# Patient Record
Sex: Male | Born: 1960 | Race: White | Hispanic: No | Marital: Married | Smoking: Never smoker
Health system: Southern US, Community
[De-identification: ages and names within clinical notes are randomized; demographics above are authoritative.]

## PROBLEM LIST (undated history)

## (undated) DIAGNOSIS — Z87442 Personal history of urinary calculi: Secondary | ICD-10-CM

## (undated) DIAGNOSIS — T7840XA Allergy, unspecified, initial encounter: Secondary | ICD-10-CM

## (undated) DIAGNOSIS — J45909 Unspecified asthma, uncomplicated: Secondary | ICD-10-CM

## (undated) DIAGNOSIS — I10 Essential (primary) hypertension: Secondary | ICD-10-CM

## (undated) HISTORY — PX: DENTAL SURGERY: SHX609

## (undated) HISTORY — DX: Personal history of urinary calculi: Z87.442

## (undated) HISTORY — DX: Unspecified asthma, uncomplicated: J45.909

## (undated) HISTORY — DX: Essential (primary) hypertension: I10

## (undated) HISTORY — DX: Allergy, unspecified, initial encounter: T78.40XA

---

## 2008-01-05 ENCOUNTER — Emergency Department (HOSPITAL_COMMUNITY): Admission: EM | Admit: 2008-01-05 | Discharge: 2008-01-05 | Payer: Self-pay | Admitting: Emergency Medicine

## 2008-06-16 ENCOUNTER — Ambulatory Visit: Payer: Self-pay | Admitting: Internal Medicine

## 2008-06-16 DIAGNOSIS — Z87442 Personal history of urinary calculi: Secondary | ICD-10-CM | POA: Insufficient documentation

## 2008-06-16 DIAGNOSIS — I10 Essential (primary) hypertension: Secondary | ICD-10-CM | POA: Insufficient documentation

## 2008-06-17 ENCOUNTER — Encounter: Payer: Self-pay | Admitting: Internal Medicine

## 2008-07-28 ENCOUNTER — Ambulatory Visit: Payer: Self-pay | Admitting: Internal Medicine

## 2008-07-28 DIAGNOSIS — N522 Drug-induced erectile dysfunction: Secondary | ICD-10-CM | POA: Insufficient documentation

## 2008-10-27 ENCOUNTER — Ambulatory Visit: Payer: Self-pay | Admitting: Internal Medicine

## 2008-10-27 DIAGNOSIS — N401 Enlarged prostate with lower urinary tract symptoms: Secondary | ICD-10-CM | POA: Insufficient documentation

## 2008-10-27 LAB — CONVERTED CEMR LAB
BUN: 17 mg/dL (ref 6–23)
Chloride: 104 meq/L (ref 96–112)
Cholesterol: 197 mg/dL (ref 0–200)
GFR calc non Af Amer: 85.54 mL/min (ref >60)
HDL: 57.3 mg/dL (ref >39.00)
LDL Cholesterol: 120 mg/dL — ABNORMAL HIGH (ref 0–99)
Potassium: 3.7 meq/L (ref 3.5–5.1)
Sodium: 139 meq/L (ref 135–145)
Triglycerides: 98 mg/dL (ref 0.0–149.0)
VLDL: 19.6 mg/dL (ref 0.0–40.0)

## 2008-10-31 ENCOUNTER — Encounter: Payer: Self-pay | Admitting: Internal Medicine

## 2008-11-10 ENCOUNTER — Telehealth: Payer: Self-pay | Admitting: Internal Medicine

## 2008-11-30 ENCOUNTER — Telehealth: Payer: Self-pay | Admitting: Internal Medicine

## 2009-03-02 ENCOUNTER — Ambulatory Visit: Payer: Self-pay | Admitting: Internal Medicine

## 2009-03-21 ENCOUNTER — Telehealth: Payer: Self-pay | Admitting: Internal Medicine

## 2009-04-12 ENCOUNTER — Telehealth: Payer: Self-pay | Admitting: Internal Medicine

## 2009-06-08 ENCOUNTER — Telehealth: Payer: Self-pay | Admitting: Internal Medicine

## 2009-06-08 ENCOUNTER — Ambulatory Visit: Payer: Self-pay | Admitting: Endocrinology

## 2009-06-08 ENCOUNTER — Encounter: Payer: Self-pay | Admitting: Internal Medicine

## 2009-06-08 DIAGNOSIS — E291 Testicular hypofunction: Secondary | ICD-10-CM | POA: Insufficient documentation

## 2009-06-08 LAB — CONVERTED CEMR LAB
BUN: 17 mg/dL (ref 6–23)
CO2: 29 meq/L (ref 19–32)
Calcium: 9.1 mg/dL (ref 8.4–10.5)
Chloride: 101 meq/L (ref 96–112)
Creatinine, Ser: 1 mg/dL (ref 0.4–1.5)
Glucose, Bld: 99 mg/dL (ref 70–99)

## 2009-07-18 ENCOUNTER — Telehealth: Payer: Self-pay | Admitting: Internal Medicine

## 2009-07-26 ENCOUNTER — Telehealth: Payer: Self-pay | Admitting: Internal Medicine

## 2009-07-27 ENCOUNTER — Telehealth: Payer: Self-pay | Admitting: Internal Medicine

## 2009-10-05 ENCOUNTER — Ambulatory Visit: Payer: Self-pay | Admitting: Internal Medicine

## 2009-10-05 LAB — CONVERTED CEMR LAB
BUN: 24 mg/dL — ABNORMAL HIGH (ref 6–23)
CO2: 28 meq/L (ref 19–32)
Calcium: 9.4 mg/dL (ref 8.4–10.5)
Creatinine, Ser: 0.9 mg/dL (ref 0.4–1.5)
GFR calc non Af Amer: 101.39 mL/min (ref >60)
Glucose, Bld: 89 mg/dL (ref 70–99)
Sodium: 142 meq/L (ref 135–145)

## 2010-01-09 ENCOUNTER — Telehealth: Payer: Self-pay | Admitting: Internal Medicine

## 2010-04-05 ENCOUNTER — Ambulatory Visit: Payer: Self-pay | Admitting: Internal Medicine

## 2010-04-19 IMAGING — CT CT ABDOMEN W/O CM
2 of 4 series · 14 of 32 positions shown, 19 images · non-contrast
Comparison: No priors

CT ABDOMEN

CLINICAL DATA: Sudden onset of left flank pain

CT ABDOMEN AND PELVIS WITHOUT CONTRAST
TECHNIQUE: Multidetector CT imaging of the abdomen and pelvis was
performed following the standard
protocol without intravenous contrast.

[Series 2: <(id) stone a/p >(id) · axial · 0.76mm/px · z∈[-479,-174]mm · 8 of 78 slices shown, 13 images]
[im 9/78  soft-tissue]
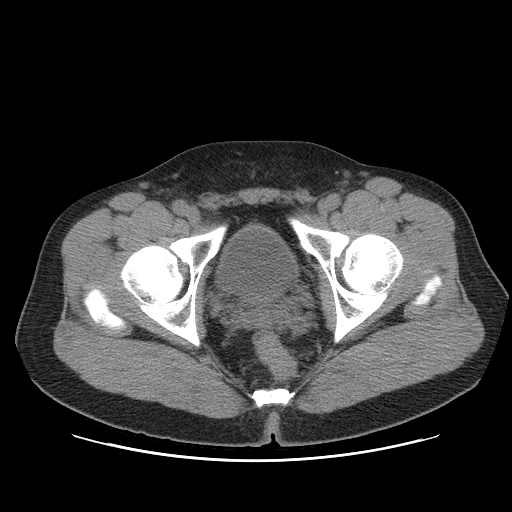
[im 9/78  bone]
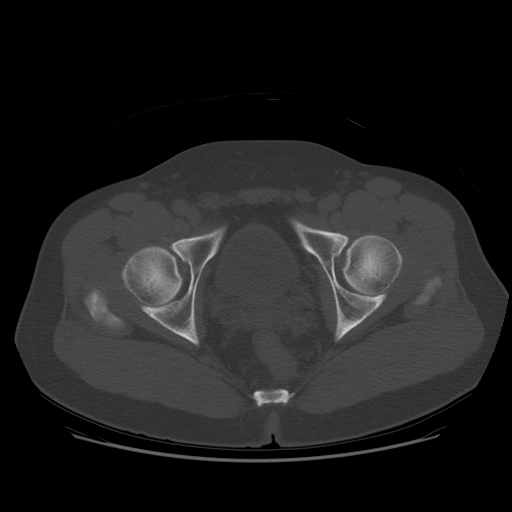
[im 18/78  soft-tissue]
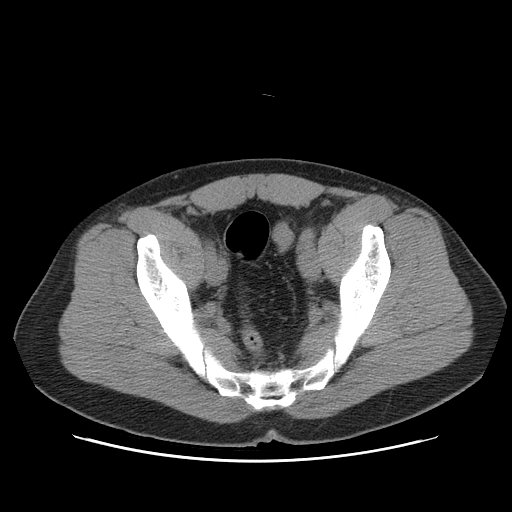
[im 26/78  soft-tissue]
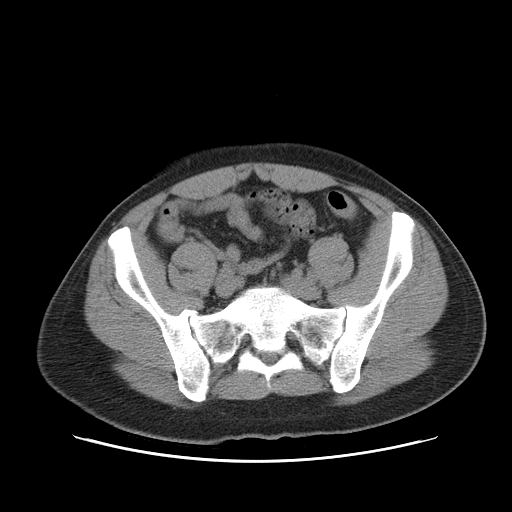
[im 35/78  soft-tissue]
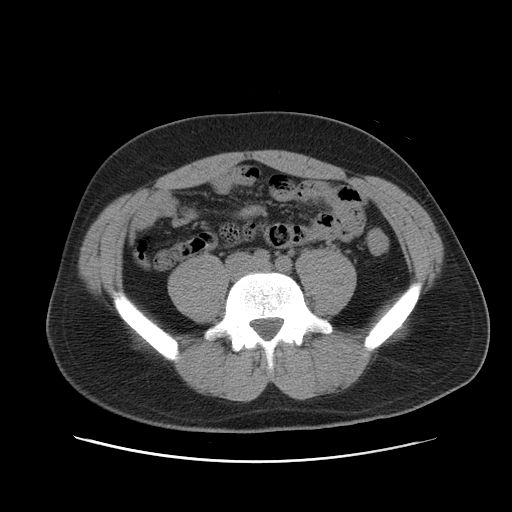
[im 43/78  soft-tissue]
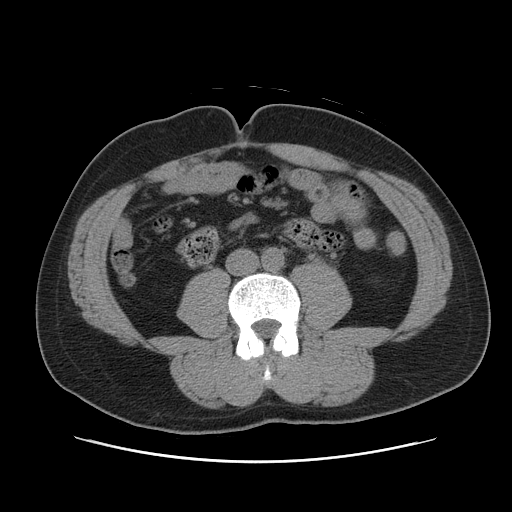
[im 43/78  lung]
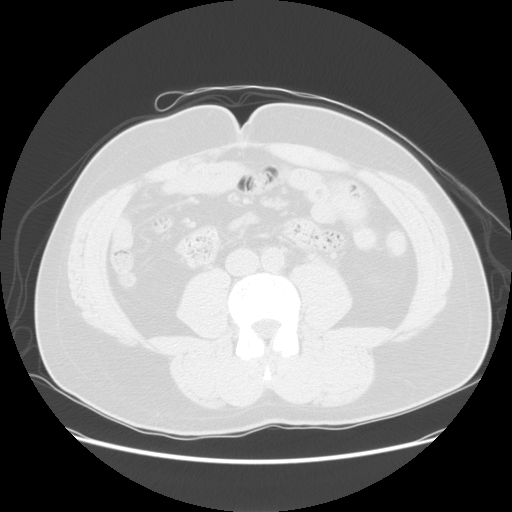
[im 52/78  soft-tissue]
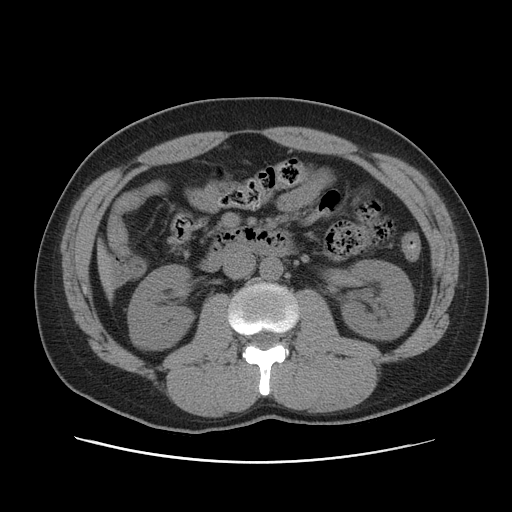
[im 52/78  lung]
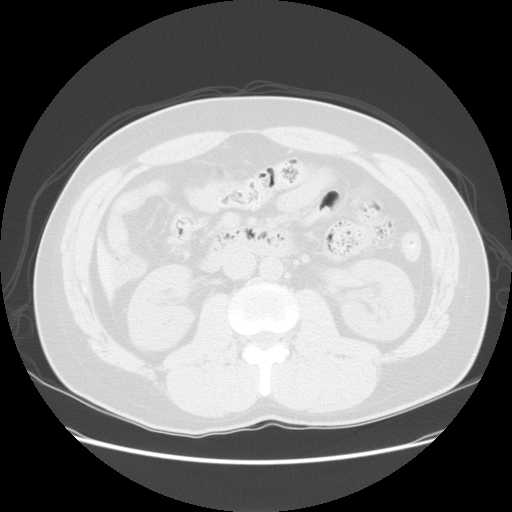
[im 60/78  soft-tissue]
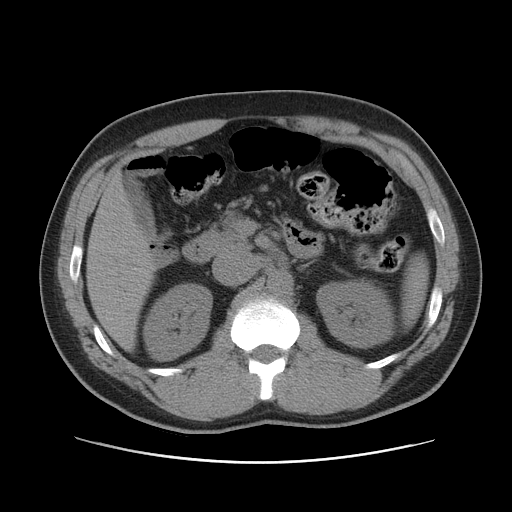
[im 60/78  lung]
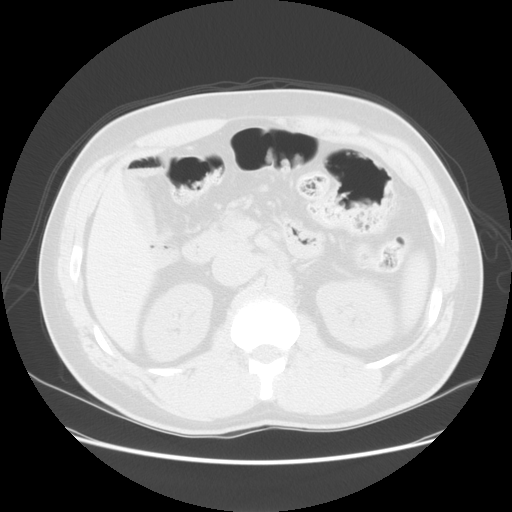
[im 69/78  soft-tissue]
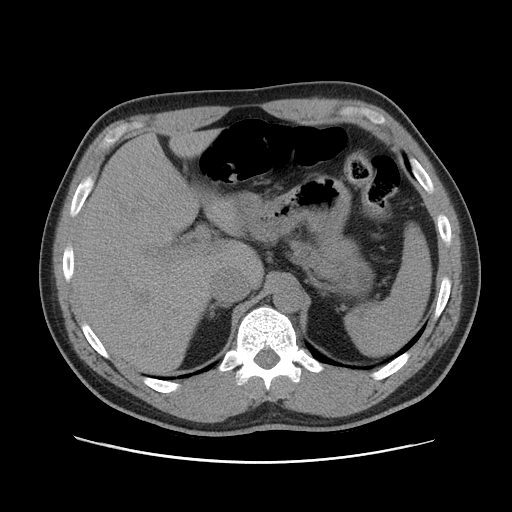
[im 69/78  lung]
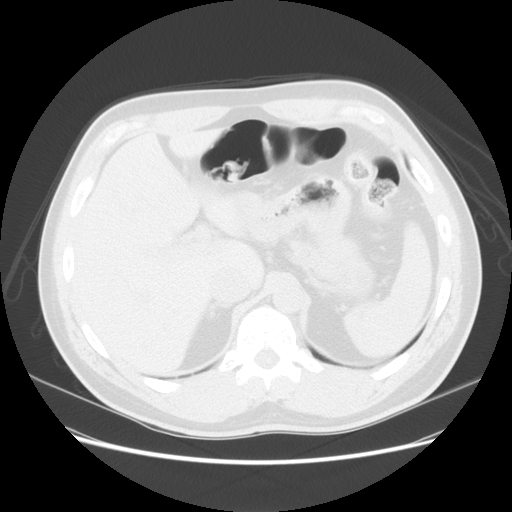

[Series 400: sagittal a/p · sagittal · 0.84mm/px · 6 of 77 slices shown]
[im 9/77  soft-tissue]
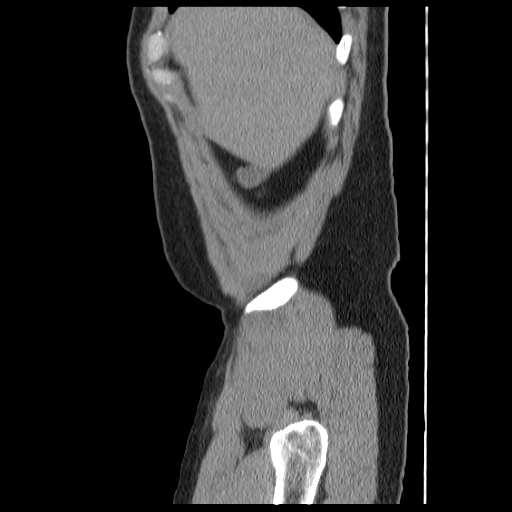
[im 17/77  soft-tissue]
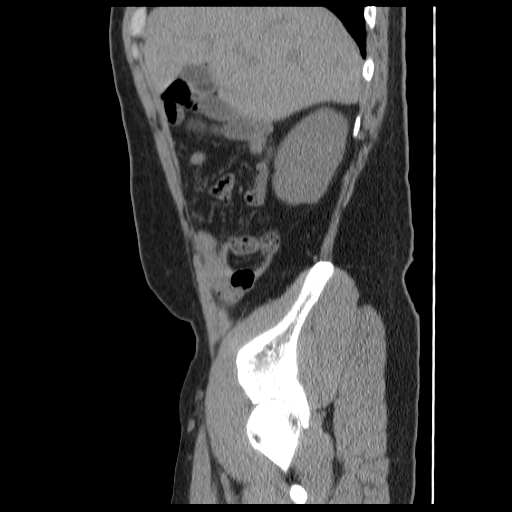
[im 26/77  soft-tissue]
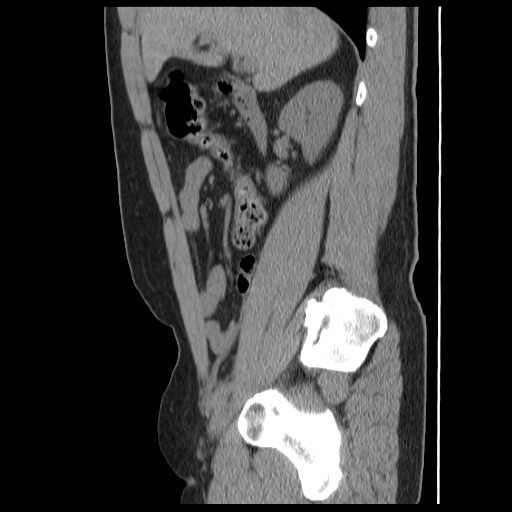
[im 34/77  soft-tissue]
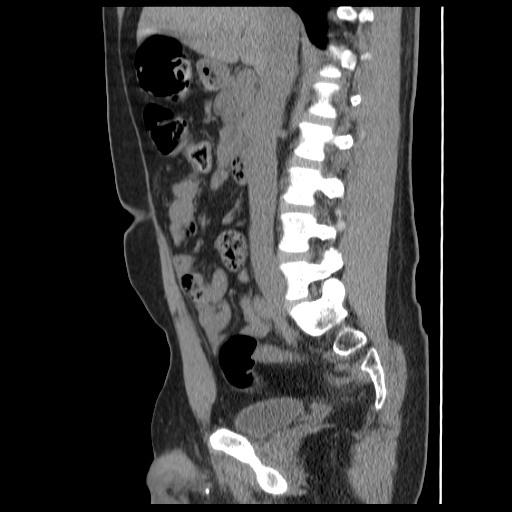
[im 43/77  soft-tissue]
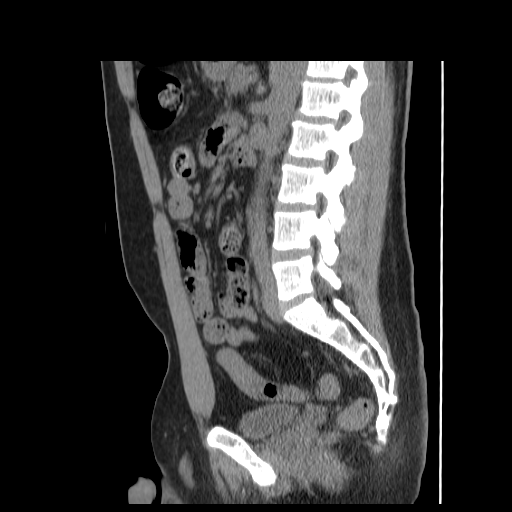
[im 51/77  soft-tissue]
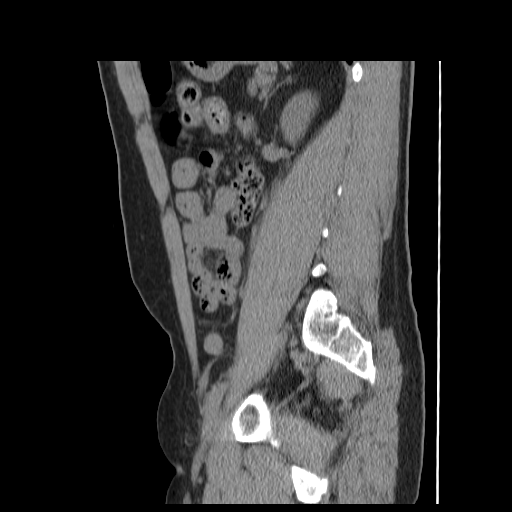

[14 of 32 positions shown; findings below may reference images not displayed]

FINDINGS: The lung bases are clear.  There are no definite renal
calculi.  There is, however, mild left hydronephrosis and mild
fullness of the left ureter compared to the right.  There is also
mild stranding around the left kidney and the left ureter.  On
axial images number 39 and 40, there is a 2 mm stone in the
proximal/mid left ureter.  In the coronal plane, the stone measures
about 4 mm. Other abdominal viscera unremarkable in the unenhanced
state.  No calcified gallstones.  No adenopathy or ascites.
IMPRESSION: 2 x 2 x 4 mm stone in the proximal/mid left ureter with mild to
moderate obstruction.

CT PELVIS
FINDINGS: No masses, adenopathy, or fluid collections.  No lower
ureteral or bladder calculi.  There are heavy prostatic
calcifications.  Osseous structures intact.
IMPRESSION: No acute findings - there are heavy prostatic calcifications.

## 2010-06-19 ENCOUNTER — Telehealth: Payer: Self-pay | Admitting: Internal Medicine

## 2010-06-24 LAB — CONVERTED CEMR LAB
Albumin: 4.1 g/dL (ref 3.5–5.2)
Alkaline Phosphatase: 58 units/L (ref 39–117)
BUN: 24 mg/dL — ABNORMAL HIGH (ref 6–23)
Basophils Absolute: 0 10*3/uL (ref 0.0–0.1)
Bilirubin, Direct: 0.2 mg/dL (ref 0.0–0.3)
Calcium: 9.7 mg/dL (ref 8.4–10.5)
Crystals: NEGATIVE
Eosinophils Absolute: 0.2 10*3/uL (ref 0.0–0.7)
GFR calc Af Amer: 117 mL/min
GFR calc non Af Amer: 97 mL/min
Glucose, Bld: 90 mg/dL (ref 70–99)
Hemoglobin, Urine: NEGATIVE
Leukocytes, UA: NEGATIVE
MCHC: 34.7 g/dL (ref 30.0–36.0)
MCV: 90.6 fL (ref 78.0–100.0)
Monocytes Absolute: 0.5 10*3/uL (ref 0.1–1.0)
Neutrophils Relative %: 62.5 % (ref 43.0–77.0)
Platelets: 233 10*3/uL (ref 150–400)
Potassium: 4 meq/L (ref 3.5–5.1)
RBC / HPF: NONE SEEN
RDW: 12.5 % (ref 11.5–14.6)
Sodium: 140 meq/L (ref 135–145)
Specific Gravity, Urine: <=1.005 (ref 1.000–1.03)
Total Protein: 7.4 g/dL (ref 6.0–8.3)
Urobilinogen, UA: 0.2 (ref 0.0–1.0)
WBC, UA: NONE SEEN cells/hpf
WBC: 5.9 10*3/uL (ref 4.5–10.5)

## 2010-06-26 NOTE — Letter (Signed)
Summary: Results Follow-up Letter  [facility]  [address] [place], [place] [address]   Phone: [phone]  Fax: [phone]    10/05/2009  [address] [place], [place]  [address]  Dear Mr. Ohlsen,   The following are the results of your recent test(s):  Test     Result     Kidney     normal Testosterone   high/normal range  _________________________________________________________  Please call for an appointment as directed _________________________________________________________ _________________________________________________________ _________________________________________________________  Sincerely,  Sanda Linger MD [facility]

## 2010-06-26 NOTE — Progress Notes (Signed)
Summary: Androgel  Phone Note Call from Patient   Summary of Call: Patient left message on triage in regards to Androgel prescription. I made patient aware that prescription was done. Patient will call [facility] to check on status and call back if prescription needs to be sent again. Initial call taken by: Lucious Groves,  July 26, 2009 8:52 AM

## 2010-06-26 NOTE — Letter (Signed)
Summary: Results Follow-up Letter  [facility]  [address] [place], [place] [address]   Phone: [phone]  Fax: [phone]    06/08/2009  [address] [place], [place]  [address]  Dear Mr. Releford,   The following are the results of your recent test(s):  Test     Result     Testosterone   low at [address] Kidney     normal   _________________________________________________________  Please call for an appointment as directed, I have increased the Androgel to 2 packets per day _________________________________________________________ _________________________________________________________ _________________________________________________________  Sincerely,  Sanda Linger MD [facility]

## 2010-06-26 NOTE — Assessment & Plan Note (Signed)
Summary: 3 MO ROV /NWS   Vital Signs:  Patient profile:   50 year old male Height:      71 inches Weight:      215 pounds BMI:     30.09 O2 Sat:      97 % on Room air Temp:     97.0 degrees F oral Pulse rate:   74 / minute Pulse rhythm:   regular Resp:     16 per minute BP sitting:   144 / 94  (left arm) Cuff size:   large  Vitals Entered By: Bill Salinas CMA (June 08, 2009 8:07 AM)  Nutrition Counseling: Patient's BMI is greater than 25 and therefore counseled on weight management options.  O2 Flow:  Room air CC: pt here for 3 month follow up. Pt is due for td booster and will have that today/ ab   Primary Care Provider:  Etta Grandchild MD  CC:  pt here for 3 month follow up. Pt is due for td booster and will have that today/ ab.  History of Present Illness:  Hypertension Follow-Up      This is a 50 year old man who presents for Hypertension follow-up.  The patient denies lightheadedness, headaches, edema, rash, and fatigue.  The patient denies the following associated symptoms: chest pain, chest pressure, exercise intolerance, dyspnea, palpitations, syncope, and leg edema.  Compliance with medications (by patient report) has been near 100%.  The patient reports that dietary compliance has been good.  The patient reports exercising daily.  Adjunctive measures currently used by the patient include salt restriction and relaxation.    Preventive Screening-Counseling & Management  Alcohol-Tobacco     Alcohol drinks/day: 0     Smoking Status: never     Passive Smoke Exposure: no  Hep-HIV-STD-Contraception     HIV Risk: no  Current Medications (verified): 1)  Cialis 20 Mg Tabs (Tadalafil) .... Take As Directed 2)  Androgel 25 Mg/2.5gm Gel (Testosterone) .... Apply One Tube To Chest Wall Once Daily 3)  Valturna 150-160 Mg Tabs (Aliskiren-Valsartan) .... Once Daily  Allergies (verified): 1)  ! Penicillin  Past History:  Past Medical History: Reviewed history from  06/16/2008 and no changes required. Hypertension Nephrolithiasis, hx of  Past Surgical History: Reviewed history from 06/16/2008 and no changes required. Denies surgical history  Family History: Reviewed history from 06/16/2008 and no changes required. Family History of Arthritis Family History Diabetes 1st degree relative Family History Hypertension  Social History: Reviewed history from 06/16/2008 and no changes required. Occupation: Claims Management for [employer] Married Never Smoked Alcohol use-no Drug use-no Regular exercise-yes  Review of Systems  The patient denies abdominal pain and hematuria.   GU:  Complains of decreased libido and erectile dysfunction; denies dysuria, hematuria, incontinence, nocturia, urinary frequency, and urinary hesitancy.  Physical Exam  General:  alert, well-developed, well-nourished, well-hydrated, and appropriate dress.   Mouth:  Oral mucosa and oropharynx without lesions or exudates.  Teeth in good repair. Neck:  supple, full ROM, and no masses.   Lungs:  Normal respiratory effort, chest expands symmetrically. Lungs are clear to auscultation, no crackles or wheezes. Heart:  Normal rate and regular rhythm. S1 and S2 normal without gallop, murmur, click, rub or other extra sounds. Abdomen:  Bowel sounds positive,abdomen soft and non-tender without masses, organomegaly or hernias noted. Msk:  No deformity or scoliosis noted of thoracic or lumbar spine.   Skin:  turgor normal, color normal, no rashes, no suspicious lesions,  no ecchymoses, no petechiae, no purpura, no ulcerations, no edema, and excessive tan.   Psych:  Oriented X3, memory intact for recent and remote, normally interactive, good eye contact, not depressed appearing, not agitated, and slightly anxious.     Impression & Recommendations:  Problem # 1:  TESTICULAR HYPOFUNCTION (ICD-257.2) Assessment New  Orders: Venipuncture ([address]109) TLB-BMP (Basic Metabolic  Panel-BMET) (80048-METABOL) TLB-Testosterone, Total (84403-TESTO)  Problem # 2:  ESSENTIAL HYPERTENSION (ICD-401.9) Assessment: Improved  His updated medication list for this problem includes:    Valturna 150-160 Mg Tabs (Aliskiren-valsartan) ..... Once daily  Orders: Venipuncture (60454) TLB-BMP (Basic Metabolic Panel-BMET) (80048-METABOL) TLB-Testosterone, Total (84403-TESTO)  BP today: 144/94 Prior BP: 160/90 (03/02/2009)  Prior 10 Yr Risk Heart Disease: 11 % (03/02/2009)  Labs Reviewed: K+: 3.7 (10/27/2008) Creat: : 1.0 (10/27/2008)   Chol: 197 (10/27/2008)   HDL: 57.30 (10/27/2008)   LDL: 120 (10/27/2008)   TG: 98.0 (10/27/2008)  Complete Medication List: 1)  Cialis 20 Mg Tabs (Tadalafil) .... Take as directed 2)  Androgel 25 Mg/2.5gm Gel (Testosterone) .... Apply one tube to chest wall once daily 3)  Valturna 150-160 Mg Tabs (Aliskiren-valsartan) .... Once daily  Other Orders: Tdap => 73yrs IM (09811) Admin 1st Vaccine (91478)  Patient Instructions: 1)  Please schedule a follow-up appointment in 4 months. 2)  Check your Blood Pressure regularly. If it is above 140/90: you should make an appointment. Prescriptions: VALTURNA 150-160 MG TABS (ALISKIREN-VALSARTAN) once daily  #90 x 3   Entered and Authorized by:   Etta Grandchild MD   Signed by:   Etta Grandchild MD on 06/08/2009   Method used:   Electronically to        [facility]* (mail-order)             ,          Ph: [phone]       Fax: [phone]   RxID:   [id number]    Immunizations Administered:  Tetanus Vaccine:    Vaccine Type: Tdap    Site: left deltoid    Mfr: GlaxoSmithKline    Dose: 0.5 ml    Route: IM    Given by: Rock Nephew CMA    Exp. Date: 07/22/2011    Lot #: [id number]    VIS given: 04/14/07 version given June 08, 2009.

## 2010-06-26 NOTE — Assessment & Plan Note (Signed)
Summary: 4 MO ROV /NWS  #   Vital Signs:  Patient profile:   50 year old male Height:      71 inches Weight:      204 pounds BMI:     28.56 O2 Sat:      97 % on Room air Temp:     97.7 degrees F oral Pulse rate:   72 / minute Pulse rhythm:   regular Resp:     16 per minute BP sitting:   136 / 86  (left arm) Cuff size:   large  Vitals Entered By: Rock Nephew CMA (Oct 05, 2009 8:26 AM)  Nutrition Counseling: Patient's BMI is greater than 25 and therefore counseled on weight management options.  O2 Flow:  Room air CC: 4 mo follow-up visit, Hypertension Management Is Patient Diabetic? No Pain Assessment Patient in pain? no        Primary Care Provider:  Etta Grandchild MD  CC:  4 mo follow-up visit and Hypertension Management.  History of Present Illness:  Follow-Up Visit      This is a 50 year old man who presents for Follow-up visit.  The patient denies chest pain, palpitations, dizziness, syncope, edema, SOB, DOE, PND, and orthopnea.  Since the last visit the patient notes no new problems or concerns.  The patient reports taking meds as prescribed and monitoring BP.  When questioned about possible medication side effects, the patient notes none.    Hypertension History:      He denies headache, chest pain, palpitations, dyspnea with exertion, orthopnea, PND, peripheral edema, visual symptoms, neurologic problems, syncope, and side effects from treatment.  He notes no problems with any antihypertensive medication side effects.        Positive major cardiovascular risk factors include male age 48 years old or older and hypertension.  Negative major cardiovascular risk factors include no history of diabetes or hyperlipidemia, negative family history for ischemic heart disease, and non-tobacco-user status.        Further assessment for target organ damage reveals no history of ASHD, cardiac end-organ damage (CHF/LVH), stroke/TIA, peripheral vascular disease, renal  insufficiency, or hypertensive retinopathy.     Preventive Screening-Counseling & Management  Alcohol-Tobacco     Alcohol drinks/day: 0     Smoking Status: never     Passive Smoke Exposure: no  Hep-HIV-STD-Contraception     Hepatitis Risk: no risk noted     HIV Risk: no     STD Risk: no risk noted      Sexual History:  currently monogamous.        Drug Use:  never.        Blood Transfusions:  no.    Clinical Review Panels:  Lipid Management   Cholesterol:  197 (10/27/2008)   LDL (bad choesterol):  120 (10/27/2008)   HDL (good cholesterol):  57.30 (10/27/2008)  Diabetes Management   Creatinine:  1.0 (06/08/2009)  CBC   WBC:  5.9 (06/16/2008)   RBC:  4.97 (06/16/2008)   Hgb:  15.6 (06/16/2008)   Hct:  45.0 (06/16/2008)   Platelets:  233 (06/16/2008)   MCV  90.6 (06/16/2008)   MCHC  34.7 (06/16/2008)   RDW  12.5 (06/16/2008)   PMN:  62.5 (06/16/2008)   Lymphs:  25.6 (06/16/2008)   Monos:  8.1 (06/16/2008)   Eosinophils:  3.0 (06/16/2008)   Basophil:  0.8 (06/16/2008)  Complete Metabolic Panel   Glucose:  99 (06/08/2009)   Sodium:  137 (06/08/2009)  Potassium:  4.3 (06/08/2009)   Chloride:  101 (06/08/2009)   CO2:  29 (06/08/2009)   BUN:  17 (06/08/2009)   Creatinine:  1.0 (06/08/2009)   Albumin:  4.1 (06/16/2008)   Total Protein:  7.4 (06/16/2008)   Calcium:  9.1 (06/08/2009)   Total Bili:  1.0 (06/16/2008)   Alk Phos:  58 (06/16/2008)   SGPT (ALT):  22 (06/16/2008)   SGOT (AST):  31 (06/16/2008)   Medications Prior to Update: 1)  Cialis 20 Mg Tabs (Tadalafil) .... Take As Directed 2)  Androgel 25 Mg/2.5gm Gel (Testosterone) .... Apply 2  Tubes  To Chest Wall Once Daily 3)  Valturna 150-160 Mg Tabs (Aliskiren-Valsartan) .... Once Daily  Current Medications (verified): 1)  Cialis 20 Mg Tabs (Tadalafil) .... Take As Directed 2)  Androgel 25 Mg/2.5gm Gel (Testosterone) .... Apply 2  Tubes  To Chest Wall Once Daily 3)  Valturna 150-160 Mg Tabs  (Aliskiren-Valsartan) .... Once Daily  Allergies (verified): 1)  ! Penicillin  Past History:  Past Medical History: Reviewed history from 06/16/2008 and no changes required. Hypertension Nephrolithiasis, hx of  Past Surgical History: Reviewed history from 06/16/2008 and no changes required. Denies surgical history  Family History: Reviewed history from 06/16/2008 and no changes required. Family History of Arthritis Family History Diabetes 1st degree relative Family History Hypertension  Social History: Reviewed history from 06/16/2008 and no changes required. Occupation: Probation officer for [employer] Married Never Smoked Alcohol use-no Drug use-no Regular exercise-yes Hepatitis Risk:  no risk noted STD Risk:  no risk noted  Review of Systems  The patient denies weight loss, weight gain, chest pain, dyspnea on exertion, prolonged cough, abdominal pain, depression, enlarged lymph nodes, and testicular masses.   GU:  Denies decreased libido, discharge, dysuria, erectile dysfunction, hematuria, and nocturia.  Physical Exam  General:  alert, well-developed, well-nourished, well-hydrated, and appropriate dress.   Mouth:  Oral mucosa and oropharynx without lesions or exudates.  Teeth in good repair. Neck:  supple, full ROM, and no masses.   Lungs:  Normal respiratory effort, chest expands symmetrically. Lungs are clear to auscultation, no crackles or wheezes. Heart:  Normal rate and regular rhythm. S1 and S2 normal without gallop, murmur, click, rub or other extra sounds. Abdomen:  Bowel sounds positive,abdomen soft and non-tender without masses, organomegaly or hernias noted. Msk:  No deformity or scoliosis noted of thoracic or lumbar spine.   Extremities:  No clubbing, cyanosis, edema, or deformity noted with normal full range of motion of all joints.   Neurologic:  No cranial nerve deficits noted. Station and gait are normal. Plantar reflexes are down-going  bilaterally. DTRs are symmetrical throughout. Sensory, motor and coordinative functions appear intact. Skin:  turgor normal, color normal, no rashes, no suspicious lesions, no ecchymoses, no petechiae, no purpura, no ulcerations, no edema, and excessive tan.   Cervical Nodes:  no anterior cervical adenopathy and no posterior cervical adenopathy.   Psych:  Oriented X3, memory intact for recent and remote, normally interactive, good eye contact, not anxious appearing, not depressed appearing, and not agitated.     Impression & Recommendations:  Problem # 1:  TESTICULAR HYPOFUNCTION (ICD-257.2) Assessment Unchanged  Orders: Venipuncture (98119) TLB-BMP (Basic Metabolic Panel-BMET) (80048-METABOL) TLB-Testosterone, Total (84403-TESTO)  Problem # 2:  ESSENTIAL HYPERTENSION (ICD-401.9) Assessment: Improved  His updated medication list for this problem includes:    Valturna 150-160 Mg Tabs (Aliskiren-valsartan) ..... Once daily  Orders: Venipuncture (14782) TLB-BMP (Basic Metabolic Panel-BMET) (80048-METABOL) TLB-Testosterone, Total (84403-TESTO)  BP today: 136/86 Prior BP: 144/94 (06/08/2009)  Prior 10 Yr Risk Heart Disease: 11 % (03/02/2009)  Labs Reviewed: K+: 4.3 (06/08/2009) Creat: : 1.0 (06/08/2009)   Chol: 197 (10/27/2008)   HDL: 57.30 (10/27/2008)   LDL: 120 (10/27/2008)   TG: 98.0 (10/27/2008)  Complete Medication List: 1)  Cialis 20 Mg Tabs (Tadalafil) .... Take as directed 2)  Androgel 25 Mg/2.5gm Gel (Testosterone) .... Apply 2  tubes  to chest wall once daily 3)  Valturna 150-160 Mg Tabs (Aliskiren-valsartan) .... Once daily  Hypertension Assessment/Plan:      The patient's hypertensive risk group is category B: At least one risk factor (excluding diabetes) with no target organ damage.  His calculated 10 year risk of coronary heart disease is 7 %.  Today's blood pressure is 136/86.  His blood pressure goal is < 140/90.  Patient Instructions: 1)  Please schedule a  follow-up appointment in 6 months. 2)  Check your Blood Pressure regularly. If it is above 140/90: you should make an appointment.

## 2010-06-26 NOTE — Assessment & Plan Note (Signed)
Summary: 6 MTH FU---STC   Vital Signs:  Patient profile:   50 year old male Height:      71 inches Weight:      208 pounds BMI:     29.11 O2 Sat:      98 % on Room air Temp:     98.1 degrees F oral Pulse rate:   77 / minute Pulse rhythm:   regular Resp:     16 per minute BP sitting:   136 / 72  (left arm) Cuff size:   large  Vitals Entered By: Rock Nephew CMA (April 05, 2010 8:35 AM)  Nutrition Counseling: Patient's BMI is greater than 25 and therefore counseled on weight management options.  O2 Flow:  Room air CC: follow-up visit// med refill Is Patient Diabetic? No  Does patient need assistance? Functional Status Self care Ambulation Normal   Primary Care Provider:  Etta Grandchild MD  CC:  follow-up visit// med refill.  History of Present Illness:  Follow-Up Visit      This is a 50 year old man who presents for Follow-up visit.  The patient denies chest pain, palpitations, dizziness, syncope, edema, SOB, DOE, PND, and orthopnea.  Since the last visit the patient notes no new problems or concerns.  The patient reports taking meds as prescribed, monitoring BP, and dietary compliance.  When questioned about possible medication side effects, the patient notes none.    Preventive Screening-Counseling & Management  Alcohol-Tobacco     Alcohol drinks/day: 0     Alcohol Counseling: not indicated; patient does not drink     Smoking Status: never     Passive Smoke Exposure: no     Tobacco Counseling: not indicated; no tobacco use  Hep-HIV-STD-Contraception     Hepatitis Risk: no risk noted     HIV Risk: no     STD Risk: no risk noted      Sexual History:  currently monogamous.        Drug Use:  never.        Blood Transfusions:  no.    Clinical Review Panels:  Prevention   Last PSA:  0.35 (10/27/2008)  Immunizations   Last Tetanus Booster:  Tdap (06/08/2009)  Lipid Management   Cholesterol:  197 (10/27/2008)   LDL (bad choesterol):  120  (10/27/2008)   HDL (good cholesterol):  57.30 (10/27/2008)  Diabetes Management   Creatinine:  0.9 (10/05/2009)  CBC   WBC:  5.9 (06/16/2008)   RBC:  4.97 (06/16/2008)   Hgb:  15.6 (06/16/2008)   Hct:  45.0 (06/16/2008)   Platelets:  233 (06/16/2008)   MCV  90.6 (06/16/2008)   MCHC  34.7 (06/16/2008)   RDW  12.5 (06/16/2008)   PMN:  62.5 (06/16/2008)   Lymphs:  25.6 (06/16/2008)   Monos:  8.1 (06/16/2008)   Eosinophils:  3.0 (06/16/2008)   Basophil:  0.8 (06/16/2008)  Complete Metabolic Panel   Glucose:  89 (10/05/2009)   Sodium:  142 (10/05/2009)   Potassium:  4.4 (10/05/2009)   Chloride:  107 (10/05/2009)   CO2:  28 (10/05/2009)   BUN:  24 (10/05/2009)   Creatinine:  0.9 (10/05/2009)   Albumin:  4.1 (06/16/2008)   Total Protein:  7.4 (06/16/2008)   Calcium:  9.4 (10/05/2009)   Total Bili:  1.0 (06/16/2008)   Alk Phos:  58 (06/16/2008)   SGPT (ALT):  22 (06/16/2008)   SGOT (AST):  31 (06/16/2008)   Current Medications (verified): 1)  Cialis 20 Mg  Tabs (Tadalafil) .... Take As Directed 2)  Androgel 25 Mg/2.5gm Gel (Testosterone) .... Apply 2  Tubes  To Chest Wall Once Daily 3)  Valturna 150-160 Mg Tabs (Aliskiren-Valsartan) .... Once Daily  Allergies (verified): 1)  ! Penicillin  Past History:  Past Medical History: Last updated: 06/16/2008 Hypertension Nephrolithiasis, hx of  Past Surgical History: Last updated: 06/16/2008 Denies surgical history  Family History: Last updated: 06/16/2008 Family History of Arthritis Family History Diabetes 1st degree relative Family History Hypertension  Social History: Last updated: 06/16/2008 Occupation: Claims Management for [employer] Married Never Smoked Alcohol use-no Drug use-no Regular exercise-yes  Risk Factors: Alcohol Use: 0 (04/05/2010) Exercise: yes (06/16/2008)  Risk Factors: Smoking Status: never (04/05/2010) Passive Smoke Exposure: no (04/05/2010)  Family History: Reviewed history  from 06/16/2008 and no changes required. Family History of Arthritis Family History Diabetes 1st degree relative Family History Hypertension  Social History: Reviewed history from 06/16/2008 and no changes required. 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   GU:  Denies decreased libido, dysuria, erectile dysfunction, hematuria, incontinence, nocturia, urinary frequency, and urinary hesitancy.  Physical Exam  General:  alert, well-developed, well-nourished, well-hydrated, and appropriate dress.   Head:  normocephalic, atraumatic, no abnormalities observed, and no abnormalities palpated.   Mouth:  Oral mucosa and oropharynx without lesions or exudates.  Teeth in good repair. Neck:  supple, full ROM, and no masses.   Lungs:  Normal respiratory effort, chest expands symmetrically. Lungs are clear to auscultation, no crackles or wheezes. Heart:  Normal rate and regular rhythm. S1 and S2 normal without gallop, murmur, click, rub or other extra sounds. Abdomen:  Bowel sounds positive,abdomen soft and non-tender without masses, organomegaly or hernias noted. Msk:  No deformity or scoliosis noted of thoracic or lumbar spine.   Extremities:  No clubbing, cyanosis, edema, or deformity noted with normal full range of motion of all joints.   Neurologic:  No cranial nerve deficits noted. Station and gait are normal. Plantar reflexes are down-going bilaterally. DTRs are symmetrical throughout. Sensory, motor and coordinative functions appear intact. Skin:  turgor normal, color normal, no rashes, no suspicious lesions, no ecchymoses, no petechiae, no  purpura, no ulcerations, no edema, and excessive tan.   Cervical Nodes:  no anterior cervical adenopathy and no posterior cervical adenopathy.   Psych:  Oriented X3, memory intact for recent and remote, normally interactive, good eye contact, not anxious appearing, not depressed appearing, and not agitated.     Impression & Recommendations:  Problem # 1:  ESSENTIAL HYPERTENSION (ICD-401.9) Assessment Improved  His updated medication list for this problem includes:    Valturna 150-160 Mg Tabs (Aliskiren-valsartan) ..... Once daily  BP today: 136/72 Prior BP: 136/86 (10/05/2009)  Prior 10 Yr Risk Heart Disease: 7 % (10/05/2009)  Labs Reviewed: K+: 4.4 (10/05/2009) Creat: : 0.9 (10/05/2009)   Chol: 197 (10/27/2008)   HDL: 57.30 (10/27/2008)   LDL: 120 (10/27/2008)   TG: 98.0 (10/27/2008)  Problem # 2:  TESTICULAR HYPOFUNCTION (ICD-257.2) Assessment: Unchanged  Problem # 3:  HYPERTROPHY PROSTATE W/UR OBST & OTH LUTS (ICD-600.01) Assessment: Unchanged  PSA: 0.35 (10/27/2008)     Complete Medication List: 1)  Cialis 20 Mg Tabs (Tadalafil) .... Take as directed 2)  Androgel  25 Mg/2.5gm Gel (Testosterone) .... Apply 2  tubes  to chest wall once daily 3)  Valturna 150-160 Mg Tabs (Aliskiren-valsartan) .... Once daily  Patient Instructions: 1)  Please schedule a follow-up appointment in 6 months. 2)  It is important that you exercise regularly at least 20 minutes 5 times a week. If you develop chest pain, have severe difficulty breathing, or feel very tired , stop exercising immediately and seek medical attention. 3)  Check your Blood Pressure regularly. If it is above 140/90: you should make an appointment. Prescriptions: ANDROGEL 25 MG/2.5GM GEL (TESTOSTERONE) apply 2  tubes  to chest wall once daily  #35mth x 1   Entered and Authorized by:   Etta Grandchild MD   Signed by:   Etta Grandchild MD on 04/05/2010   Method used:   Printed then [facility]ed to ...       [facility] [id number]       [address]       [place], [place]  [address]       Ph: [phone] or [phone]       Fax: [phone]   RxID:   [id number]    Orders Added: 1)  Est. Patient Level III [27062]

## 2010-06-26 NOTE — Progress Notes (Signed)
  Phone Note Refill Request Call back at [phone] Message from:  Fax from Pharmacy on January 09, 2010 10:32 AM  Refills Requested: Medication #1:  ANDROGEL 25 MG/2.5GM GEL apply 2  tubes  to chest wall once daily   Supply Requested: 6 months please advise refill  Initial call taken by: Ami Bullins CMA,  January 09, 2010 10:33 AM  Follow-up for Phone Call        ok Follow-up by: Etta Grandchild MD,  January 09, 2010 10:42 AM  Additional Follow-up for Phone Call Additional follow up Details #1::        lm for pt to call back to verify mail order Additional Follow-up by: Ami Bullins CMA,  January 09, 2010 11:01 AM    Additional Follow-up for Phone Call Additional follow up Details #2::    Pt needs rx to go to [facility]. Follow-up by: Lamar Sprinkles, CMA,  January 10, 2010 9:52 AM  Additional Follow-up for Phone Call Additional follow up Details #3:: Details for Additional Follow-up Action Taken: Faxed today Additional Follow-up by: Lamar Sprinkles, CMA,  January 10, 2010 10:55 AM  Prescriptions: ANDROGEL 25 MG/2.5GM GEL (TESTOSTERONE) apply 2  tubes  to chest wall once daily  #33mth x 1   Entered by:   Lamar Sprinkles, CMA   Authorized by:   Etta Grandchild MD   Signed by:   Lamar Sprinkles, CMA on 01/10/2010   Method used:   Printed then faxed to ...       [facility] (mail-order)             , [place]         Ph: [phone]       Fax: [phone]   RxID:   [id number]

## 2010-06-26 NOTE — Progress Notes (Signed)
       New/Updated Medications: ANDROGEL 25 MG/2.5GM GEL (TESTOSTERONE) apply 2  tubes  to chest wall once daily Prescriptions: ANDROGEL 25 MG/2.5GM GEL (TESTOSTERONE) apply 2  tubes  to chest wall once daily  #60 x 5   Entered and Authorized by:   Etta Grandchild MD   Signed by:   Etta Grandchild MD on 06/08/2009   Method used:   Printed then faxed to ...       [id number]       [address]       [place], [place]  [address]       Ph: [phone] or [phone]       Fax: [phone]   RxID:   [id number]

## 2010-06-26 NOTE — Progress Notes (Signed)
Summary: REFILL  Phone Note Refill Request   Refills Requested: Medication #1:  ANDROGEL 25 MG/2.5GM GEL apply 2  tubes  to chest wall once daily To go to [facility] ok?   Initial call taken by: Lamar Sprinkles, CMA,  July 18, 2009 11:16 AM  Follow-up for Phone Call        yes Follow-up by: Etta Grandchild MD,  July 18, 2009 11:17 AM  Additional Follow-up for Phone Call Additional follow up Details #1::        rx faxed to [facility] [phone] Additional Follow-up by: Rock Nephew CMA,  July 19, 2009 8:46 AM    Prescriptions: ANDROGEL 25 MG/2.5GM GEL (TESTOSTERONE) apply 2  tubes  to chest wall once daily  #180 x 1   Entered by:   Lamar Sprinkles, CMA   Authorized by:   Etta Grandchild MD   Signed by:   Lamar Sprinkles, CMA on 07/18/2009   Method used:   Printed then faxed to ...       [facility] (mail-order)             ,          Ph: [phone]       Fax: [phone]   RxID:   [id number]

## 2010-06-26 NOTE — Progress Notes (Signed)
  Phone Note Call from Patient   Summary of Call: Patient is requesting 2 wk supply at local pharm while waiting on mail order.  Initial call taken by: Lamar Sprinkles, CMA,  July 27, 2009 4:24 PM    Prescriptions: ANDROGEL 25 MG/2.5GM GEL (TESTOSTERONE) apply 2  tubes  to chest wall once daily  #28 x 1   Entered by:   Lamar Sprinkles, CMA   Authorized by:   Etta Grandchild MD   Signed by:   Lamar Sprinkles, CMA on 07/27/2009   Method used:   Telephoned to ...       [facility]  [address] [id number]       [address]       [place], [place]  [address]       Ph: [phone] or [phone]       Fax: [phone]   RxID:   [id number]

## 2010-06-28 NOTE — Progress Notes (Signed)
  Phone Note Refill Request Call back at [phone] Message from:  Patient  Refills Requested: Medication #1:  ANDROGEL 25 MG/2.5GM GEL apply 2  tubes  to chest wall once daily   Supply Requested: 3 months  Patient request 90day rx faxed to [facility]. Is this ok  Initial call taken by: Rock Nephew CMA,  June 19, 2010 4:55 PM  Follow-up for Phone Call        yes Follow-up by: Etta Grandchild MD,  June 19, 2010 5:25 PM    Prescriptions: ANDROGEL 25 MG/2.5GM GEL (TESTOSTERONE) apply 2  tubes  to chest wall once daily  #31mth x 3   Entered by:   Rock Nephew CMA   Authorized by:   Etta Grandchild MD   Signed by:   Rock Nephew CMA on 06/20/2010   Method used:   Printed then faxed to ...       [facility] (mail-order)             , [place]         Ph: [phone]       Fax: [phone]   RxID:   [id number]

## 2010-09-18 ENCOUNTER — Telehealth: Payer: Self-pay | Admitting: *Deleted

## 2010-09-18 NOTE — Telephone Encounter (Signed)
Patient requesting RX for androgel to go to [facility]. OK to call in?

## 2010-09-19 NOTE — Telephone Encounter (Signed)
No, he is due for follow-up

## 2010-09-19 NOTE — Telephone Encounter (Signed)
Pt aware, he is already scheduled for f/u apt 10/05/10. Ok for 1 mth of rx to be [facility] [address]?

## 2010-09-19 NOTE — Telephone Encounter (Signed)
yes

## 2010-09-20 MED ORDER — TESTOSTERONE 25 MG/2.5GM (1%) TD GEL: 2.0000 "application " | Freq: Every day | TRANSDERMAL | Status: DC

## 2010-10-03 ENCOUNTER — Encounter: Payer: Self-pay | Admitting: Internal Medicine

## 2010-10-05 ENCOUNTER — Encounter: Payer: Self-pay | Admitting: Internal Medicine

## 2010-10-05 ENCOUNTER — Other Ambulatory Visit (INDEPENDENT_AMBULATORY_CARE_PROVIDER_SITE_OTHER): Payer: BC Managed Care – PPO

## 2010-10-05 ENCOUNTER — Ambulatory Visit (INDEPENDENT_AMBULATORY_CARE_PROVIDER_SITE_OTHER): Payer: BC Managed Care – PPO | Admitting: Internal Medicine

## 2010-10-05 DIAGNOSIS — N401 Enlarged prostate with lower urinary tract symptoms: Secondary | ICD-10-CM

## 2010-10-05 DIAGNOSIS — N138 Other obstructive and reflux uropathy: Secondary | ICD-10-CM

## 2010-10-05 DIAGNOSIS — F528 Other sexual dysfunction not due to a substance or known physiological condition: Secondary | ICD-10-CM

## 2010-10-05 DIAGNOSIS — I10 Essential (primary) hypertension: Secondary | ICD-10-CM

## 2010-10-05 DIAGNOSIS — E291 Testicular hypofunction: Secondary | ICD-10-CM

## 2010-10-05 LAB — LIPID PANEL
Total CHOL/HDL Ratio: 4
Triglycerides: 37 mg/dL (ref 0.0–149.0)

## 2010-10-05 LAB — CBC WITH DIFFERENTIAL/PLATELET
Basophils Absolute: 0 10*3/uL (ref 0.0–0.1)
Basophils Relative: 0.4 % (ref 0.0–3.0)
Eosinophils Absolute: 0.4 10*3/uL (ref 0.0–0.7)
MCHC: 34.4 g/dL (ref 30.0–36.0)
MCV: 92.4 fl (ref 78.0–100.0)
Monocytes Absolute: 0.5 10*3/uL (ref 0.1–1.0)
Neutrophils Relative %: 55.9 % (ref 43.0–77.0)
RBC: 5.11 Mil/uL (ref 4.22–5.81)
RDW: 13.6 % (ref 11.5–14.6)

## 2010-10-05 LAB — COMPREHENSIVE METABOLIC PANEL
ALT: 27 U/L (ref 0–53)
CO2: 30 mEq/L (ref 19–32)
Calcium: 9.3 mg/dL (ref 8.4–10.5)
Chloride: 105 mEq/L (ref 96–112)
Creatinine, Ser: 0.9 mg/dL (ref 0.4–1.5)
GFR: 102.33 mL/min (ref >60.00)
Glucose, Bld: 94 mg/dL (ref 70–99)
Sodium: 141 mEq/L (ref 135–145)
Total Protein: 7.1 g/dL (ref 6.0–8.3)

## 2010-10-05 LAB — TSH: TSH: 2.02 u[IU]/mL (ref 0.35–5.50)

## 2010-10-05 MED ORDER — TESTOSTERONE 25 MG/2.5GM (1%) TD GEL: 2.0000 "application " | Freq: Every day | TRANSDERMAL | Status: DC

## 2010-10-05 MED ORDER — OLMESARTAN MEDOXOMIL-HCTZ 20-12.5 MG PO TABS: 1.0000 | ORAL_TABLET | Freq: Every day | ORAL | Status: DC

## 2010-10-05 NOTE — Progress Notes (Signed)
Subjective:    Patient ID: Lawrence Smith, male    DOB: 1960-07-03, 50 y.o.   MRN: [id number]  Hypertension This is a chronic problem. The current episode started more than 1 year ago. The problem has been gradually worsening since onset. The problem is uncontrolled. Pertinent negatives include no anxiety, blurred vision, chest pain, headaches, malaise/fatigue, neck pain, orthopnea, palpitations, peripheral edema, PND, shortness of breath or sweats. There are no associated agents to hypertension. Past treatments include angiotensin blockers. The current treatment provides significant improvement. Compliance problems include medication side effects.       Review of Systems  Constitutional: Negative for fever, chills, malaise/fatigue, diaphoresis, activity change, appetite change, fatigue and unexpected weight change.  HENT: Negative for facial swelling and neck pain.   Eyes: Negative for blurred vision, photophobia and visual disturbance.  Respiratory: Negative for apnea, cough, choking, chest tightness, shortness of breath, wheezing and stridor.   Cardiovascular: Negative for chest pain, palpitations, orthopnea, leg swelling and PND.  Gastrointestinal: Negative for nausea, vomiting, abdominal pain, diarrhea, constipation, blood in stool and abdominal distention.  Genitourinary: Negative for dysuria, urgency, frequency, hematuria, flank pain, decreased urine volume, discharge, penile swelling, scrotal swelling, enuresis, difficulty urinating, genital sores, penile pain and testicular pain.  Musculoskeletal: Negative for myalgias, back pain, joint swelling, arthralgias and gait problem.  Skin: Negative for color change, pallor and rash.  Neurological: Negative for dizziness, tremors, seizures, syncope, facial asymmetry, speech difficulty, weakness, light-headedness, numbness and headaches.  Hematological: Negative for adenopathy. Does not bruise/bleed easily.  Psychiatric/Behavioral: Negative for  suicidal ideas, hallucinations, behavioral problems, confusion, sleep disturbance, self-injury, dysphoric mood, decreased concentration and agitation. The patient is not nervous/anxious and is not hyperactive.        Objective:   Physical Exam  Vitals reviewed. Constitutional: He is oriented to person, place, and time. He appears well-developed and well-nourished. No distress.  HENT:  Head: Normocephalic and atraumatic.  Right Ear: External ear normal.  Left Ear: External ear normal.  Nose: Nose normal.  Mouth/Throat: Oropharynx is clear and moist. No oropharyngeal exudate.  Eyes: Conjunctivae and EOM are normal. Pupils are equal, round, and reactive to light. Right eye exhibits no discharge. Left eye exhibits no discharge. No scleral icterus.  Neck: Normal range of motion. Neck supple. No JVD present. No tracheal deviation present. No thyromegaly present.  Cardiovascular: Normal rate, regular rhythm, normal heart sounds and intact distal pulses.  Exam reveals no gallop and no friction rub.   No murmur heard. Pulmonary/Chest: Breath sounds normal. No stridor. No respiratory distress. He has no wheezes. He has no rales. He exhibits no tenderness.  Abdominal: Soft. Bowel sounds are normal. He exhibits no distension and no mass. There is no tenderness. There is no rebound and no guarding.  Musculoskeletal: Normal range of motion. He exhibits no edema and no tenderness.  Lymphadenopathy:    He has no cervical adenopathy.  Neurological: He is alert and oriented to person, place, and time. He has normal reflexes.  Skin: Skin is warm and dry. No rash noted. He is not diaphoretic. No erythema. No pallor.  Psychiatric: He has a normal mood and affect. His behavior is normal. Judgment and thought content normal.        Lab Results  Component Value Date   WBC 5.9 06/16/2008   HGB 15.6 06/16/2008   HCT 45.0 06/16/2008   PLT 233 06/16/2008   CHOL 197 10/27/2008   TRIG 98.0 10/27/2008   HDL 57.30  10/27/2008  ALT 22 06/16/2008   AST 31 06/16/2008   NA 142 10/05/2009   K 4.4 10/05/2009   CL 107 10/05/2009   CREATININE 0.9 10/05/2009   BUN 24* 10/05/2009   CO2 28 10/05/2009   TSH 1.61 06/16/2008   PSA 0.35 10/27/2008    Assessment & Plan:

## 2010-10-05 NOTE — Assessment & Plan Note (Signed)
Check labs today.

## 2010-10-05 NOTE — Assessment & Plan Note (Signed)
Continue cialis and testosterone

## 2010-10-05 NOTE — Patient Instructions (Signed)

## 2010-10-05 NOTE — Assessment & Plan Note (Signed)
Change to benicar-hct to get better BP control

## 2010-10-14 ENCOUNTER — Encounter: Payer: Self-pay | Admitting: Internal Medicine

## 2010-12-05 ENCOUNTER — Ambulatory Visit (INDEPENDENT_AMBULATORY_CARE_PROVIDER_SITE_OTHER): Payer: BC Managed Care – PPO | Admitting: Internal Medicine

## 2010-12-05 ENCOUNTER — Encounter: Payer: Self-pay | Admitting: Internal Medicine

## 2010-12-05 VITALS — BP 124/74 | HR 88 | Temp 98.0°F | Resp 16 | Ht 70.0 in | Wt 202.0 lb

## 2010-12-05 DIAGNOSIS — I10 Essential (primary) hypertension: Secondary | ICD-10-CM

## 2010-12-05 MED ORDER — OLMESARTAN MEDOXOMIL-HCTZ 20-12.5 MG PO TABS: 1.0000 | ORAL_TABLET | Freq: Every day | ORAL | Status: DC

## 2010-12-05 NOTE — Assessment & Plan Note (Signed)
BP is well controlled, will continue benicar-hct

## 2010-12-05 NOTE — Patient Instructions (Signed)

## 2010-12-05 NOTE — Progress Notes (Signed)
  Subjective:    Patient ID: Lawrence Smith, male    DOB: 1961-01-25, 50 y.o.   MRN: [id number]  Hypertension This is a chronic problem. The current episode started more than 1 year ago. The problem has been gradually improving since onset. The problem is controlled. Pertinent negatives include no anxiety, blurred vision, chest pain, headaches, malaise/fatigue, neck pain, orthopnea, palpitations, peripheral edema, PND, shortness of breath or sweats. There are no associated agents to hypertension. Past treatments include angiotensin blockers and diuretics. The current treatment provides significant improvement. There are no compliance problems.       Review of Systems  Constitutional: Negative.  Negative for malaise/fatigue.  HENT: Negative.  Negative for neck pain.   Eyes: Negative.  Negative for blurred vision.  Respiratory: Negative.  Negative for shortness of breath.   Cardiovascular: Negative.  Negative for chest pain, palpitations, orthopnea and PND.  Gastrointestinal: Negative.   Genitourinary: Negative.   Musculoskeletal: Negative.   Skin: Negative.   Neurological: Negative.  Negative for headaches.  Hematological: Negative.   Psychiatric/Behavioral: Negative.        Objective:   Physical Exam  Vitals reviewed. Constitutional: He is oriented to person, place, and time. He appears well-developed and well-nourished. No distress.  HENT:  Head: Normocephalic and atraumatic.  Right Ear: External ear normal.  Left Ear: External ear normal.  Nose: Nose normal.  Mouth/Throat: No oropharyngeal exudate.  Eyes: Conjunctivae and EOM are normal. Pupils are equal, round, and reactive to light. Right eye exhibits no discharge. Left eye exhibits no discharge. No scleral icterus.  Neck: Normal range of motion. Neck supple. No JVD present. No tracheal deviation present. No thyromegaly present.  Cardiovascular: Normal rate, regular rhythm, normal heart sounds and intact distal pulses.  Exam  reveals no gallop and no friction rub.   No murmur heard. Pulmonary/Chest: Effort normal and breath sounds normal. No stridor. No respiratory distress. He has no wheezes. He has no rales. He exhibits no tenderness.  Abdominal: Soft. Bowel sounds are normal. He exhibits no distension and no mass. There is no tenderness. There is no rebound and no guarding.  Musculoskeletal: Normal range of motion. He exhibits no edema and no tenderness.  Lymphadenopathy:    He has no cervical adenopathy.  Neurological: He is alert and oriented to person, place, and time. He has normal reflexes. He displays normal reflexes. No cranial nerve deficit. He exhibits normal muscle tone. Coordination normal.  Skin: Skin is warm and dry. No rash noted. He is not diaphoretic. No erythema. No pallor.  Psychiatric: He has a normal mood and affect. His behavior is normal. Judgment and thought content normal.         Lab Results  Component Value Date   WBC 5.2 10/05/2010   HGB 16.2 10/05/2010   HCT 47.2 10/05/2010   PLT 237.0 10/05/2010   CHOL 191 10/05/2010   TRIG 37.0 10/05/2010   HDL 47.70 10/05/2010   ALT 27 10/05/2010   AST 26 10/05/2010   NA 141 10/05/2010   K 4.7 10/05/2010   CL 105 10/05/2010   CREATININE 0.9 10/05/2010   BUN 18 10/05/2010   CO2 30 10/05/2010   TSH 2.02 10/05/2010   PSA 0.40 10/05/2010   Assessment & Plan:

## 2011-01-14 ENCOUNTER — Other Ambulatory Visit: Payer: Self-pay

## 2011-01-14 DIAGNOSIS — E291 Testicular hypofunction: Secondary | ICD-10-CM

## 2011-01-14 DIAGNOSIS — F528 Other sexual dysfunction not due to a substance or known physiological condition: Secondary | ICD-10-CM

## 2011-01-14 MED ORDER — TESTOSTERONE 25 MG/2.5GM (1%) TD GEL: 2.0000 "application " | Freq: Every day | TRANSDERMAL | Status: DC

## 2011-01-14 NOTE — Telephone Encounter (Signed)
Rx faxed to pharmacy  

## 2011-01-14 NOTE — Telephone Encounter (Signed)
Pt called requesting refill of Androgel to [facility].

## 2011-02-04 ENCOUNTER — Other Ambulatory Visit: Payer: Self-pay | Admitting: *Deleted

## 2011-02-04 MED ORDER — TADALAFIL 20 MG PO TABS: 20.0000 mg | ORAL_TABLET | Freq: Every day | ORAL | Status: DC | PRN

## 2011-02-07 ENCOUNTER — Other Ambulatory Visit: Payer: Self-pay | Admitting: Internal Medicine

## 2011-02-22 LAB — POCT I-STAT, CHEM 8
Calcium, Ion: 1.18
Chloride: 107
Glucose, Bld: 129 — ABNORMAL HIGH
HCT: 48
Hemoglobin: 16.3
TCO2: 27

## 2011-02-22 LAB — URINALYSIS, ROUTINE W REFLEX MICROSCOPIC
Glucose, UA: NEGATIVE
pH: 7.5

## 2011-02-22 LAB — URINE MICROSCOPIC-ADD ON

## 2011-04-08 ENCOUNTER — Telehealth: Payer: Self-pay | Admitting: *Deleted

## 2011-04-08 DIAGNOSIS — E291 Testicular hypofunction: Secondary | ICD-10-CM

## 2011-04-08 DIAGNOSIS — F528 Other sexual dysfunction not due to a substance or known physiological condition: Secondary | ICD-10-CM

## 2011-04-08 NOTE — Telephone Encounter (Signed)
ok 

## 2011-04-08 NOTE — Telephone Encounter (Signed)
Refill request for androgel

## 2011-04-09 MED ORDER — TESTOSTERONE 25 MG/2.5GM (1%) TD GEL: 2.0000 "application " | Freq: Every day | TRANSDERMAL | Status: DC

## 2011-06-07 ENCOUNTER — Ambulatory Visit (INDEPENDENT_AMBULATORY_CARE_PROVIDER_SITE_OTHER): Payer: BC Managed Care – PPO | Admitting: Internal Medicine

## 2011-06-07 ENCOUNTER — Encounter: Payer: Self-pay | Admitting: Internal Medicine

## 2011-06-07 ENCOUNTER — Other Ambulatory Visit (INDEPENDENT_AMBULATORY_CARE_PROVIDER_SITE_OTHER): Payer: BC Managed Care – PPO

## 2011-06-07 VITALS — BP 136/80 | HR 87 | Temp 98.1°F | Resp 16 | Wt 201.0 lb

## 2011-06-07 DIAGNOSIS — E291 Testicular hypofunction: Secondary | ICD-10-CM

## 2011-06-07 DIAGNOSIS — I10 Essential (primary) hypertension: Secondary | ICD-10-CM

## 2011-06-07 DIAGNOSIS — F528 Other sexual dysfunction not due to a substance or known physiological condition: Secondary | ICD-10-CM

## 2011-06-07 DIAGNOSIS — Z23 Encounter for immunization: Secondary | ICD-10-CM

## 2011-06-07 DIAGNOSIS — N401 Enlarged prostate with lower urinary tract symptoms: Secondary | ICD-10-CM

## 2011-06-07 LAB — COMPREHENSIVE METABOLIC PANEL
ALT: 29 U/L (ref 0–53)
Alkaline Phosphatase: 49 U/L (ref 39–117)
CO2: 29 mEq/L (ref 19–32)
GFR: 75.78 mL/min (ref >60.00)
Sodium: 139 mEq/L (ref 135–145)
Total Bilirubin: 1.5 mg/dL — ABNORMAL HIGH (ref 0.3–1.2)
Total Protein: 7.3 g/dL (ref 6.0–8.3)

## 2011-06-07 LAB — CBC WITH DIFFERENTIAL/PLATELET
Basophils Absolute: 0 10*3/uL (ref 0.0–0.1)
HCT: 49.9 % (ref 39.0–52.0)
Lymphs Abs: 1.4 10*3/uL (ref 0.7–4.0)
Monocytes Relative: 8.6 % (ref 3.0–12.0)
Platelets: 247 10*3/uL (ref 150.0–400.0)
RDW: 13.2 % (ref 11.5–14.6)

## 2011-06-07 LAB — URINALYSIS, ROUTINE W REFLEX MICROSCOPIC
Nitrite: NEGATIVE
pH: 6.5 (ref 5.0–8.0)

## 2011-06-07 LAB — FECAL OCCULT BLOOD, GUAIAC: Fecal Occult Blood: NEGATIVE

## 2011-06-07 MED ORDER — OLMESARTAN MEDOXOMIL-HCTZ 20-12.5 MG PO TABS: 1.0000 | ORAL_TABLET | Freq: Every day | ORAL | Status: DC

## 2011-06-07 MED ORDER — TESTOSTERONE 20.25 MG/ACT (1.62%) TD GEL: 2.0000 | Freq: Every day | TRANSDERMAL | Status: DC

## 2011-06-07 NOTE — Assessment & Plan Note (Signed)
Continue cialis 

## 2011-06-07 NOTE — Patient Instructions (Signed)

## 2011-06-07 NOTE — Assessment & Plan Note (Signed)
Appears to have NED by symptoms and exam, I will check his PSA level today

## 2011-06-07 NOTE — Assessment & Plan Note (Signed)
He has some symptoms that are concerning for a suboptimal testosterone level so I have changed him to androgel 1.62% and will check his T level today

## 2011-06-07 NOTE — Assessment & Plan Note (Signed)
BP is well controlled, I will check his lytes and renal function today 

## 2011-06-07 NOTE — Progress Notes (Signed)
Subjective:    Patient ID: Lawrence Smith, male    DOB: February 24, 1961, 51 y.o.   MRN: [id number]  Hypertension This is a chronic problem. The current episode started more than 1 year ago. The problem has been gradually improving since onset. The problem is controlled. Pertinent negatives include no anxiety, blurred vision, chest pain, headaches, malaise/fatigue, neck pain, orthopnea, palpitations, peripheral edema, PND, shortness of breath or sweats. Past treatments include angiotensin blockers and diuretics. The current treatment provides significant improvement. There are no compliance problems.   Erectile Dysfunction This is a chronic problem. The current episode started more than 1 year ago. The problem is unchanged. The nature of his difficulty is maintaining erection, penetration and achieving erection. Non-physiologic factors contributing to erectile dysfunction are a decreased libido. He reports no anxiety or performance anxiety. Irritative symptoms do not include frequency, nocturia or urgency. Obstructive symptoms do not include dribbling, incomplete emptying, an intermittent stream, a slower stream, straining or a weak stream. Pertinent negatives include no chills, dysuria, genital pain, hematuria, hesitancy or inability to urinate. The symptoms are aggravated by medications. Past treatments include tadalafil. The treatment provided moderate relief. He has been using treatment for 1 to 2 years. He has had no adverse reactions caused by medications. Risk factors include hypertension.      Review of Systems  Constitutional: Positive for fatigue. Negative for fever, chills, malaise/fatigue, diaphoresis, activity change, appetite change and unexpected weight change.  HENT: Negative for neck pain.   Eyes: Negative.  Negative for blurred vision.  Respiratory: Negative for cough, chest tightness, shortness of breath, wheezing and stridor.   Cardiovascular: Negative for chest pain, palpitations,  orthopnea, leg swelling and PND.  Gastrointestinal: Negative for nausea, vomiting, abdominal pain, diarrhea and constipation.  Genitourinary: Positive for decreased libido. Negative for dysuria, hesitancy, urgency, frequency, hematuria, flank pain, decreased urine volume, enuresis, difficulty urinating, incomplete emptying and nocturia.  Musculoskeletal: Negative for myalgias, back pain, joint swelling, arthralgias and gait problem.  Skin: Negative for color change, pallor, rash and wound.  Neurological: Negative for dizziness, tremors, seizures, syncope, facial asymmetry, speech difficulty, light-headedness, numbness and headaches.  Hematological: Negative for adenopathy. Does not bruise/bleed easily.  Psychiatric/Behavioral: Negative for suicidal ideas, hallucinations, behavioral problems, confusion, sleep disturbance, self-injury, dysphoric mood, decreased concentration and agitation. The patient is not nervous/anxious and is not hyperactive.        Objective:   Physical Exam  Vitals reviewed. Constitutional: He is oriented to person, place, and time. He appears well-developed and well-nourished. No distress.  HENT:  Head: Normocephalic and atraumatic.  Mouth/Throat: Oropharynx is clear and moist. No oropharyngeal exudate.  Eyes: Conjunctivae are normal. Right eye exhibits no discharge. Left eye exhibits no discharge. No scleral icterus.  Neck: Normal range of motion. Neck supple. No JVD present. No tracheal deviation present. No thyromegaly present.  Cardiovascular: Normal rate, regular rhythm, normal heart sounds and intact distal pulses.  Exam reveals no gallop and no friction rub.   No murmur heard. Pulmonary/Chest: Effort normal and breath sounds normal. No stridor. No respiratory distress. He has no wheezes. He has no rales. He exhibits no tenderness.  Abdominal: Soft. Bowel sounds are normal. He exhibits no distension and no mass. There is no tenderness. There is no rebound and no  guarding.  Genitourinary: Rectum normal, prostate normal and penis normal. Guaiac negative stool. No penile tenderness.  Musculoskeletal: Normal range of motion. He exhibits no edema and no tenderness.  Lymphadenopathy:    He has no cervical  adenopathy.  Neurological: He is oriented to person, place, and time.  Skin: Skin is warm and dry. No rash noted. He is not diaphoretic. No erythema. No pallor.  Psychiatric: He has a normal mood and affect. His behavior is normal. Judgment and thought content normal.      Lab Results  Component Value Date   WBC 5.2 10/05/2010   HGB 16.2 10/05/2010   HCT 47.2 10/05/2010   PLT 237.0 10/05/2010   GLUCOSE 94 10/05/2010   CHOL 191 10/05/2010   TRIG 37.0 10/05/2010   HDL 47.70 10/05/2010   LDLCALC 136* 10/05/2010   ALT 27 10/05/2010   AST 26 10/05/2010   NA 141 10/05/2010   K 4.7 10/05/2010   CL 105 10/05/2010   CREATININE 0.9 10/05/2010   BUN 18 10/05/2010   CO2 30 10/05/2010   TSH 2.02 10/05/2010   PSA 0.40 10/05/2010      Assessment & Plan:

## 2011-06-09 ENCOUNTER — Encounter: Payer: Self-pay | Admitting: Internal Medicine

## 2011-06-10 LAB — TESTOSTERONE, FREE, TOTAL, SHBG: Testosterone: 433.22 ng/dL (ref 250–890)

## 2011-06-14 ENCOUNTER — Other Ambulatory Visit: Payer: Self-pay

## 2011-06-14 DIAGNOSIS — I10 Essential (primary) hypertension: Secondary | ICD-10-CM

## 2011-06-14 DIAGNOSIS — E291 Testicular hypofunction: Secondary | ICD-10-CM

## 2011-06-14 MED ORDER — TESTOSTERONE 20.25 MG/ACT (1.62%) TD GEL: 2.0000 | Freq: Every day | TRANSDERMAL | Status: DC

## 2011-06-14 MED ORDER — OLMESARTAN MEDOXOMIL-HCTZ 20-12.5 MG PO TABS: 1.0000 | ORAL_TABLET | Freq: Every day | ORAL | Status: DC

## 2011-06-14 NOTE — Telephone Encounter (Signed)
Patient request rx to go to mail order company// Rx sent to [facility]

## 2011-12-06 ENCOUNTER — Ambulatory Visit (INDEPENDENT_AMBULATORY_CARE_PROVIDER_SITE_OTHER): Payer: BC Managed Care – PPO | Admitting: Internal Medicine

## 2011-12-06 ENCOUNTER — Encounter: Payer: Self-pay | Admitting: Internal Medicine

## 2011-12-06 VITALS — BP 140/86 | HR 76 | Temp 97.1°F | Resp 16 | Wt 198.0 lb

## 2011-12-06 DIAGNOSIS — N401 Enlarged prostate with lower urinary tract symptoms: Secondary | ICD-10-CM | POA: Insufficient documentation

## 2011-12-06 DIAGNOSIS — I10 Essential (primary) hypertension: Secondary | ICD-10-CM

## 2011-12-06 DIAGNOSIS — N138 Other obstructive and reflux uropathy: Secondary | ICD-10-CM | POA: Insufficient documentation

## 2011-12-06 MED ORDER — TADALAFIL 5 MG PO TABS: 5.0000 mg | ORAL_TABLET | Freq: Every day | ORAL | Status: DC

## 2011-12-06 NOTE — Patient Instructions (Signed)
Benign Prostatic Hyperplasia You have an enlarged prostate. This is common in elderly males. It is called BPH. This stands for benign prostate hyperplasia. The prostate gland is located in base of the bladder. When it grows, the prostate blocks the urethra. This is the tube which drains urine from the bladder.  SYMPTOMS  Weak urine stream.   Dribbling.   Feeling like the bladder has not emptied completely.   Difficulty starting urination.   Getting up frequently at night to urinate.   Urinating more frequently during the day.  Complete urinary blockage or severe pain with urination requires immediate attention. DIAGNOSIS   Your caregiver often has a good idea what is wrong by taking a history and doing a physical exam.   Special x-rays may be done.  TREATMENT   For mild problems, no treatment may be necessary.   If the problems are moderate, medications may provide relief. Some of these work by making the prostate gland smaller. The herb saw palmetto is commonly used.   If complete blockage occurs, a Foley catheter is usually left in place for a few days.   Surgery is often needed for more severe problems. TURP is the prostate surgery for BPH which is done through the urethra. TURP stands for transurethral resection of the prostate. It involves cutting away chips from the prostate. It is done by removing chips so that they can come out through the penis.   Techniques using heat, microwave and laser to remove the prostate blockage are also being used.  HOME CARE INSTRUCTIONS   Give yourself time when you urinate.   Stay away from alcohol.   Beverages containing caffeine such as coffee, tea and colas can make the problems worse.   Decongestants, antihistamines, and some prescription medicines can also make the problem worse.   Follow up with your caregiver for further treatment as recommended.  SEEK IMMEDIATE MEDICAL CARE IF:   You develop increased pain with urination or  are unable to pass your water.   You develop severe abdominal pain, vomiting, a high fever, or fainting.   You develop back pain or blood in your urine.  MAKE SURE YOU:   Understand these instructions.   Will watch your condition.   Will get help right away if you are not doing well or get worse.  Document Released: 05/13/2005 Document Revised: 05/02/2011 Document Reviewed: 01/16/2007 [facility] Patient Information 2012 [place], [place].

## 2011-12-06 NOTE — Assessment & Plan Note (Signed)
He has decided to stop taking benicar-hct, his BP is well controlled with lifestyle modifications, he will monitor his BP at home and report the results to me

## 2011-12-06 NOTE — Assessment & Plan Note (Addendum)
Will change cialis to the daily dose for symptom relief

## 2011-12-06 NOTE — Progress Notes (Signed)
Subjective:    Patient ID: Lawrence Smith, male    DOB: Dec 03, 1960, 51 y.o.   MRN: [id number]  Benign Prostatic Hypertrophy This is a new problem. The current episode started more than 1 month ago. The problem is unchanged. Irritative symptoms include frequency. Irritative symptoms do not include nocturia or urgency. Obstructive symptoms include dribbling, incomplete emptying, a slower stream and a weak stream. Obstructive symptoms do not include an intermittent stream or straining. Pertinent negatives include no chills, dysuria, genital pain, hematuria, hesitancy, nausea or vomiting. AUA score is 8-19. He is sexually active. The symptoms are aggravated by diuretics. Treatments tried: cialis. The treatment provided moderate relief. He has been using treatment for 6 to 12 months.      Review of Systems  Constitutional: Negative for fever, chills, diaphoresis, activity change, appetite change, fatigue and unexpected weight change.  HENT: Negative.   Eyes: Negative.   Respiratory: Negative for cough, chest tightness, shortness of breath, wheezing and stridor.   Cardiovascular: Negative for chest pain, palpitations and leg swelling.  Gastrointestinal: Negative for nausea, vomiting, abdominal pain, diarrhea, constipation and blood in stool.  Genitourinary: Positive for frequency and incomplete emptying. Negative for dysuria, hesitancy, urgency, hematuria, flank pain, decreased urine volume, enuresis, difficulty urinating, testicular pain and nocturia.  Musculoskeletal: Negative for myalgias, back pain, joint swelling, arthralgias and gait problem.  Skin: Negative for color change, pallor, rash and wound.  Neurological: Negative.   Hematological: Negative for adenopathy. Does not bruise/bleed easily.  Psychiatric/Behavioral: Negative.        Objective:   Physical Exam  Vitals reviewed. Constitutional: He is oriented to person, place, and time. He appears well-developed and well-nourished. No  distress.  HENT:  Head: Normocephalic and atraumatic.  Mouth/Throat: Oropharynx is clear and moist. No oropharyngeal exudate.  Eyes: Conjunctivae are normal. Right eye exhibits no discharge. Left eye exhibits no discharge. No scleral icterus.  Neck: Normal range of motion. Neck supple. No JVD present. No tracheal deviation present. No thyromegaly present.  Cardiovascular: Normal rate, regular rhythm, normal heart sounds and intact distal pulses.  Exam reveals no gallop and no friction rub.   No murmur heard. Pulmonary/Chest: Effort normal and breath sounds normal. No stridor. No respiratory distress. He has no wheezes. He has no rales. He exhibits no tenderness.  Abdominal: Soft. Bowel sounds are normal. He exhibits no distension and no mass. There is no tenderness. There is no rebound and no guarding.  Musculoskeletal: Normal range of motion. He exhibits no edema and no tenderness.  Lymphadenopathy:    He has no cervical adenopathy.  Neurological: He is oriented to person, place, and time.  Skin: Skin is warm and dry. No rash noted. He is not diaphoretic. No erythema. No pallor.  Psychiatric: He has a normal mood and affect. His behavior is normal. Judgment and thought content normal.      Lab Results  Component Value Date   WBC 6.2 06/07/2011   HGB 17.2* 06/07/2011   HCT 49.9 06/07/2011   PLT 247.0 06/07/2011   GLUCOSE 100* 06/07/2011   CHOL 191 10/05/2010   TRIG 37.0 10/05/2010   HDL 47.70 10/05/2010   LDLCALC 136* 10/05/2010   ALT 29 06/07/2011   AST 22 06/07/2011   NA 139 06/07/2011   K 4.4 06/07/2011   CL 102 06/07/2011   CREATININE 1.1 06/07/2011   BUN 19 06/07/2011   CO2 29 06/07/2011   TSH 1.52 06/07/2011   PSA 0.70 06/07/2011      Assessment &  Plan:

## 2011-12-19 ENCOUNTER — Telehealth: Payer: Self-pay

## 2011-12-19 NOTE — Telephone Encounter (Signed)
No PA form received, will call pharmacy to rejection information

## 2011-12-19 NOTE — Telephone Encounter (Signed)
Pt called requesting the status of PA for Cialis.

## 2011-12-24 NOTE — Telephone Encounter (Signed)
[facility] [phone], awaiting form to be faxed for completion (case# [id number])

## 2011-12-25 NOTE — Telephone Encounter (Signed)
Form received and pending completion

## 2011-12-25 NOTE — Telephone Encounter (Signed)
Form faxed to [facility] and copy sent to be scaned

## 2011-12-30 ENCOUNTER — Telehealth: Payer: Self-pay

## 2011-12-30 NOTE — Telephone Encounter (Signed)
Received denial from insurance company, cialis not covered due to not being prescribed by urologist. Pharmacy notified   Rock Nephew, [facility] 12/25/2011 3:07 PM Signed Form faxed to [facility] and copy sent to be scaned Rock Nephew, [facility] 12/25/2011 8:54 AM Signed  Form received and pending completion Rock Nephew, CMA 12/24/2011 1:50 PM Signed  Egbert Garibaldi [phone], awaiting form to be faxed for completion (case# [id number]) Rock Nephew, CMA 12/19/2011 4:04 PM Signed  No PA form received, will call pharmacy to rejection information Margaret Pyle, CMA 12/19/2011 1:23 PM Signed  Pt called requesting the status of PA for Cialis.

## 2012-02-21 ENCOUNTER — Other Ambulatory Visit: Payer: Self-pay | Admitting: Internal Medicine

## 2012-02-21 NOTE — Telephone Encounter (Signed)
Caller: Lawrence Smith/Patient; Patient Name: Lawrence Smith; PCP: Sanda Linger (Adults only); [facility] Call[facility] Phone Number: [phone].  Patient calling about cialis Rx.  States has been taking cialis 20mg , and at last visit with Dr. Yetta Barre, was switched to cialis 5mg  daily dose.  Insurance company denied Rx, stating it has to be prescribed by urologist.  When patient asked for refill of his 20mg  cialis instead, the pharmacy told him that Rx was expired and needed renewal by MD.  States prefers to use cialis 20mg  rather than visiting urologist if possible, and then discuss visiting urologist in January when he sees Dr. Yetta Barre again.  Declines triage; info to office for provider review/Rx/callback.    Uses [facility]. May reach patient at [phone].

## 2012-02-24 ENCOUNTER — Telehealth: Payer: Self-pay | Admitting: Internal Medicine

## 2012-02-24 NOTE — Telephone Encounter (Signed)
Caller: [name]/Patient; Patient Name: Lawrence Smith; PCP: Sanda Linger (Adults only); [facility] Call[facility] Phone Number: [phone]  02-24-12 he had called in on Friday the 27th about a refill of his cialis  He had not heard anything from the office    I checked in Epic and saw a Rx had been called in on the 27th I instructed him to call and check with the pharmacy.

## 2012-03-30 ENCOUNTER — Other Ambulatory Visit: Payer: Self-pay

## 2012-03-30 ENCOUNTER — Ambulatory Visit: Payer: BC Managed Care – PPO | Admitting: Internal Medicine

## 2012-03-30 DIAGNOSIS — E291 Testicular hypofunction: Secondary | ICD-10-CM

## 2012-03-30 MED ORDER — TESTOSTERONE 20.25 MG/ACT (1.62%) TD GEL: 2.0000 | Freq: Every day | TRANSDERMAL | Status: DC

## 2012-06-05 ENCOUNTER — Other Ambulatory Visit (INDEPENDENT_AMBULATORY_CARE_PROVIDER_SITE_OTHER): Payer: BC Managed Care – PPO

## 2012-06-05 ENCOUNTER — Ambulatory Visit (INDEPENDENT_AMBULATORY_CARE_PROVIDER_SITE_OTHER): Payer: BC Managed Care – PPO | Admitting: Internal Medicine

## 2012-06-05 ENCOUNTER — Encounter: Payer: Self-pay | Admitting: Internal Medicine

## 2012-06-05 VITALS — BP 134/88 | HR 75 | Temp 97.7°F | Resp 16 | Wt 197.8 lb

## 2012-06-05 DIAGNOSIS — Z0000 Encounter for general adult medical examination without abnormal findings: Secondary | ICD-10-CM | POA: Insufficient documentation

## 2012-06-05 DIAGNOSIS — R9431 Abnormal electrocardiogram [ECG] [EKG]: Secondary | ICD-10-CM

## 2012-06-05 DIAGNOSIS — R7309 Other abnormal glucose: Secondary | ICD-10-CM

## 2012-06-05 DIAGNOSIS — I10 Essential (primary) hypertension: Secondary | ICD-10-CM

## 2012-06-05 LAB — CBC WITH DIFFERENTIAL/PLATELET
Basophils Absolute: 0 10*3/uL (ref 0.0–0.1)
Eosinophils Absolute: 0.2 10*3/uL (ref 0.0–0.7)
Lymphocytes Relative: 25.9 % (ref 12.0–46.0)
MCHC: 33.8 g/dL (ref 30.0–36.0)
Monocytes Relative: 8.3 % (ref 3.0–12.0)
Neutro Abs: 3.2 10*3/uL (ref 1.4–7.7)
Neutrophils Relative %: 61.4 % (ref 43.0–77.0)
Platelets: 241 10*3/uL (ref 150.0–400.0)
RDW: 13.1 % (ref 11.5–14.6)

## 2012-06-05 LAB — COMPREHENSIVE METABOLIC PANEL
ALT: 24 U/L (ref 0–53)
AST: 25 U/L (ref 0–37)
Albumin: 4 g/dL (ref 3.5–5.2)
CO2: 29 mEq/L (ref 19–32)
Calcium: 9.6 mg/dL (ref 8.4–10.5)
Chloride: 105 mEq/L (ref 96–112)
Creatinine, Ser: 1 mg/dL (ref 0.4–1.5)
GFR: 80.51 mL/min (ref >60.00)
Potassium: 4.4 mEq/L (ref 3.5–5.1)
Total Protein: 7.1 g/dL (ref 6.0–8.3)

## 2012-06-05 LAB — URINALYSIS, ROUTINE W REFLEX MICROSCOPIC
Ketones, ur: NEGATIVE
Leukocytes, UA: NEGATIVE
Nitrite: NEGATIVE
Specific Gravity, Urine: 1.02 (ref 1.000–1.030)
Urobilinogen, UA: 0.2 (ref 0.0–1.0)
pH: 6 (ref 5.0–8.0)

## 2012-06-05 LAB — HEMOGLOBIN A1C: Hgb A1c MFr Bld: 5.5 % (ref 4.6–6.5)

## 2012-06-05 LAB — LIPID PANEL
LDL Cholesterol: 120 mg/dL — ABNORMAL HIGH (ref 0–99)
Total CHOL/HDL Ratio: 4
Triglycerides: 59 mg/dL (ref 0.0–149.0)

## 2012-06-05 LAB — TSH: TSH: 2.63 u[IU]/mL (ref 0.35–5.50)

## 2012-06-05 NOTE — Assessment & Plan Note (Signed)
I will check his a1c to see if he has developed DM II 

## 2012-06-05 NOTE — Assessment & Plan Note (Signed)
Exam done Vaccines were reviewed Labs ordered Pt ed material was given 

## 2012-06-05 NOTE — Progress Notes (Signed)
Subjective:    Patient ID: Lawrence Smith, male    DOB: 11-26-1960, 52 y.o.   MRN: [id number]  Hypertension This is a chronic problem. The current episode started more than 1 year ago. The problem has been gradually improving since onset. The problem is controlled. Pertinent negatives include no anxiety, blurred vision, chest pain, headaches, malaise/fatigue, neck pain, orthopnea, palpitations, peripheral edema, PND, shortness of breath or sweats. Past treatments include nothing. The current treatment provides moderate improvement. There are no compliance problems.       Review of Systems  Constitutional: Negative.  Negative for malaise/fatigue.  HENT: Negative.  Negative for neck pain.   Eyes: Negative.  Negative for blurred vision.  Respiratory: Negative for apnea, cough, choking, chest tightness, shortness of breath, wheezing and stridor.   Cardiovascular: Negative for chest pain, palpitations, orthopnea, leg swelling and PND.  Gastrointestinal: Negative.   Genitourinary: Positive for difficulty urinating (some weak stream). Negative for dysuria, urgency, frequency, hematuria, flank pain, decreased urine volume, discharge, penile swelling, scrotal swelling, enuresis, genital sores, penile pain and testicular pain.  Musculoskeletal: Negative for myalgias, back pain, joint swelling, arthralgias and gait problem.  Skin: Negative.   Neurological: Negative.  Negative for dizziness, weakness, light-headedness and headaches.  Hematological: Negative for adenopathy. Does not bruise/bleed easily.  Psychiatric/Behavioral: Negative.        Objective:   Physical Exam  Vitals reviewed. Constitutional: He appears well-developed and well-nourished. No distress.  HENT:  Head: Normocephalic and atraumatic.  Mouth/Throat: Oropharynx is clear and moist. No oropharyngeal exudate.  Eyes: Conjunctivae normal are normal. Right eye exhibits no discharge. Left eye exhibits no discharge. No scleral icterus.   Neck: Normal range of motion. Neck supple. No JVD present. No tracheal deviation present. No thyromegaly present.  Cardiovascular: Normal rate, regular rhythm, normal heart sounds and intact distal pulses.  Exam reveals no gallop and no friction rub.   No murmur heard. Pulmonary/Chest: Effort normal and breath sounds normal. No stridor. No respiratory distress. He has no wheezes. He has no rales. He exhibits no tenderness.  Abdominal: Soft. Bowel sounds are normal. He exhibits no distension and no mass. There is no tenderness. There is no rebound and no guarding. Hernia confirmed negative in the right inguinal area and confirmed negative in the left inguinal area.  Genitourinary: Rectum normal, prostate normal, testes normal and penis normal. Rectal exam shows no external hemorrhoid, no internal hemorrhoid, no fissure, no mass, no tenderness and anal tone normal. Guaiac negative stool. Prostate is not enlarged and not tender. Right testis shows no mass, no swelling and no tenderness. Right testis is descended. Left testis shows no mass, no swelling and no tenderness. Left testis is descended. Circumcised. No penile erythema or penile tenderness. No discharge found.  Lymphadenopathy:    He has no cervical adenopathy.       Right: No inguinal adenopathy present.       Left: No inguinal adenopathy present.  Skin: He is not diaphoretic.     Lab Results  Component Value Date   WBC 6.2 06/07/2011   HGB 17.2* 06/07/2011   HCT 49.9 06/07/2011   PLT 247.0 06/07/2011   GLUCOSE 100* 06/07/2011   CHOL 191 10/05/2010   TRIG 37.0 10/05/2010   HDL 47.70 10/05/2010   LDLCALC 136* 10/05/2010   ALT 29 06/07/2011   AST 22 06/07/2011   NA 139 06/07/2011   K 4.4 06/07/2011   CL 102 06/07/2011   CREATININE 1.1 06/07/2011   BUN 19  06/07/2011   CO2 29 06/07/2011   TSH 1.52 06/07/2011   PSA 0.70 06/07/2011       Assessment & Plan:

## 2012-06-05 NOTE — Assessment & Plan Note (Signed)
He has adequate BP control 

## 2012-06-05 NOTE — Patient Instructions (Signed)
Health Maintenance, Males A healthy lifestyle and preventative care can promote health and wellness.  Maintain regular health, dental, and eye exams.  Eat a healthy diet. Foods like vegetables, fruits, whole grains, low-fat dairy products, and lean protein foods contain the nutrients you need without too many calories. Decrease your intake of foods high in solid fats, added sugars, and salt. Get information about a proper diet from your caregiver, if necessary.  Regular physical exercise is one of the most important things you can do for your health. Most adults should get at least 150 minutes of moderate-intensity exercise (any activity that increases your heart rate and causes you to sweat) each week. In addition, most adults need muscle-strengthening exercises on 2 or more days a week.   Maintain a healthy weight. The body mass index (BMI) is a screening tool to identify possible weight problems. It provides an estimate of body fat based on height and weight. Your caregiver can help determine your BMI, and can help you achieve or maintain a healthy weight. For adults 20 years and older:  A BMI below 18.5 is considered underweight.  A BMI of 18.5 to 24.9 is normal.  A BMI of 25 to 29.9 is considered overweight.  A BMI of 30 and above is considered obese.  Maintain normal blood lipids and cholesterol by exercising and minimizing your intake of saturated fat. Eat a balanced diet with plenty of fruits and vegetables. Blood tests for lipids and cholesterol should begin at age 20 and be repeated every 5 years. If your lipid or cholesterol levels are high, you are over 50, or you are a high risk for heart disease, you may need your cholesterol levels checked more frequently.Ongoing high lipid and cholesterol levels should be treated with medicines, if diet and exercise are not effective.  If you smoke, find out from your caregiver how to quit. If you do not use tobacco, do not start.  If you  choose to drink alcohol, do not exceed 2 drinks per day. One drink is considered to be 12 ounces (355 mL) of beer, 5 ounces (148 mL) of wine, or 1.5 ounces (44 mL) of liquor.  Avoid use of street drugs. Do not share needles with anyone. Ask for help if you need support or instructions about stopping the use of drugs.  High blood pressure causes heart disease and increases the risk of stroke. Blood pressure should be checked at least every 1 to 2 years. Ongoing high blood pressure should be treated with medicines if weight loss and exercise are not effective.  If you are 45 to 50 years old, ask your caregiver if you should take aspirin to prevent heart disease.  Diabetes screening involves taking a blood sample to check your fasting blood sugar level. This should be done once every 3 years, after age 45, if you are within normal weight and without risk factors for diabetes. Testing should be considered at a younger age or be carried out more frequently if you are overweight and have at least 1 risk factor for diabetes.  Colorectal cancer can be detected and often prevented. Most routine colorectal cancer screening begins at the age of 50 and continues through age 75. However, your caregiver may recommend screening at an earlier age if you have risk factors for colon cancer. On a yearly basis, your caregiver may provide home test kits to check for hidden blood in the stool. Use of a small camera at the end of a tube,   to directly examine the colon (sigmoidoscopy or colonoscopy), can detect the earliest forms of colorectal cancer. Talk to your caregiver about this at age 50, when routine screening begins. Direct examination of the colon should be repeated every 5 to 10 years through age 75, unless early forms of pre-cancerous polyps or small growths are found.  Hepatitis C blood testing is recommended for all people born from 1945 through 1965 and any individual with known risks for hepatitis C.  Healthy  men should no longer receive prostate-specific antigen (PSA) blood tests as part of routine cancer screening. Consult with your caregiver about prostate cancer screening.  Testicular cancer screening is not recommended for adolescents or adult males who have no symptoms. Screening includes self-exam, caregiver exam, and other screening tests. Consult with your caregiver about any symptoms you have or any concerns you have about testicular cancer.  Practice safe sex. Use condoms and avoid high-risk sexual practices to reduce the spread of sexually transmitted infections (STIs).  Use sunscreen with a sun protection factor (SPF) of 30 or greater. Apply sunscreen liberally and repeatedly throughout the day. You should seek shade when your shadow is shorter than you. Protect yourself by wearing long sleeves, pants, a wide-brimmed hat, and sunglasses year round, whenever you are outdoors.  Notify your caregiver of new moles or changes in moles, especially if there is a change in shape or color. Also notify your caregiver if a mole is larger than the size of a pencil eraser.  A one-time screening for abdominal aortic aneurysm (AAA) and surgical repair of large AAAs by sound wave imaging (ultrasonography) is recommended for ages 65 to 75 years who are current or former smokers.  Stay current with your immunizations. Document Released: 11/09/2007 Document Revised: 08/05/2011 Document Reviewed: 10/08/2010 [employer] Patient Information 2013 [employer].  

## 2012-06-05 NOTE — Assessment & Plan Note (Addendum)
His EKG shows some development of LAE He is concerned about heart disease so I have asked him to do a stress ECHO

## 2012-06-22 ENCOUNTER — Telehealth: Payer: Self-pay | Admitting: Internal Medicine

## 2012-06-22 ENCOUNTER — Other Ambulatory Visit (HOSPITAL_COMMUNITY): Payer: BC Managed Care – PPO

## 2012-06-22 ENCOUNTER — Ambulatory Visit (HOSPITAL_BASED_OUTPATIENT_CLINIC_OR_DEPARTMENT_OTHER): Payer: BC Managed Care – PPO

## 2012-06-22 ENCOUNTER — Ambulatory Visit (HOSPITAL_COMMUNITY): Payer: BC Managed Care – PPO | Attending: Cardiovascular Disease | Admitting: Radiology

## 2012-06-22 DIAGNOSIS — I10 Essential (primary) hypertension: Secondary | ICD-10-CM

## 2012-06-22 DIAGNOSIS — R0989 Other specified symptoms and signs involving the circulatory and respiratory systems: Secondary | ICD-10-CM

## 2012-06-22 DIAGNOSIS — R9431 Abnormal electrocardiogram [ECG] [EKG]: Secondary | ICD-10-CM

## 2012-06-22 NOTE — Progress Notes (Signed)
Unable to perform stress echocardiogram due hypertension per DOD, Dr. Dietrich Pates.  Blood pressure pre-test was 167/107 sitting and 154/112 standing.  Dr. Yetta Barre notified. Dr. Tenny Craw recommends Myoview secondary to hypertension.

## 2012-06-22 NOTE — Telephone Encounter (Signed)
Patient calling, he went to have stress test this morning and they were not able to do due his bp 157/110.  They advised him to f/u with Dr. Yetta Barre.  States that he has been off of his medication for 6 months.  He monitors his b/p at home and his diastolic has not been over 94.  Denies any chest pain or shortness of breath.  Is asking if a medication can be called in without another OV, was in 2 weeks ago?  [facility].

## 2012-06-23 MED ORDER — VALSARTAN-HYDROCHLOROTHIAZIDE 320-12.5 MG PO TABS: 1.0000 | ORAL_TABLET | Freq: Every day | ORAL | Status: DC

## 2012-06-23 NOTE — Telephone Encounter (Signed)
Pt informed Diovan sent to [facility].

## 2012-06-23 NOTE — Telephone Encounter (Signed)
An Rx was sent to his pharmacy 

## 2012-10-26 ENCOUNTER — Telehealth: Payer: Self-pay | Admitting: *Deleted

## 2012-10-26 DIAGNOSIS — E291 Testicular hypofunction: Secondary | ICD-10-CM

## 2012-10-26 MED ORDER — TESTOSTERONE 20.25 MG/ACT (1.62%) TD GEL: 2.0000 | Freq: Every day | TRANSDERMAL | Status: DC

## 2012-10-26 NOTE — Telephone Encounter (Signed)
Pt requesting refill of Androgel be sent to [facility]-pt has upcoming appointment in July (7/18_  and will be out of refills by then.

## 2012-10-26 NOTE — Telephone Encounter (Signed)
[facility] request. Pharmacy to notify

## 2012-10-26 NOTE — Telephone Encounter (Signed)
done

## 2012-12-11 ENCOUNTER — Ambulatory Visit: Payer: BC Managed Care – PPO | Admitting: Internal Medicine

## 2012-12-14 ENCOUNTER — Ambulatory Visit (INDEPENDENT_AMBULATORY_CARE_PROVIDER_SITE_OTHER): Payer: BC Managed Care – PPO | Admitting: Internal Medicine

## 2012-12-14 ENCOUNTER — Encounter: Payer: Self-pay | Admitting: Internal Medicine

## 2012-12-14 VITALS — BP 140/92 | HR 66 | Temp 97.6°F | Resp 16 | Wt 203.0 lb

## 2012-12-14 DIAGNOSIS — I10 Essential (primary) hypertension: Secondary | ICD-10-CM

## 2012-12-14 NOTE — Assessment & Plan Note (Signed)
His BP is well controlled I will check his lytes and renal function 

## 2012-12-14 NOTE — Progress Notes (Signed)
  Subjective:    Patient ID: Lawrence Smith, male    DOB: 1961-05-19, 52 y.o.   MRN: [id number]  Hypertension This is a chronic problem. The current episode started more than 1 year ago. The problem has been gradually improving since onset. The problem is controlled. Pertinent negatives include no anxiety, blurred vision, chest pain, headaches, malaise/fatigue, neck pain, orthopnea, palpitations, peripheral edema, PND, shortness of breath or sweats. Past treatments include angiotensin blockers and diuretics. The current treatment provides moderate improvement. There are no compliance problems.       Review of Systems  Constitutional: Negative for malaise/fatigue.  HENT: Negative for neck pain.   Eyes: Negative for blurred vision.  Respiratory: Negative for shortness of breath.   Cardiovascular: Negative for chest pain, palpitations, orthopnea and PND.  Neurological: Negative for headaches.  All other systems reviewed and are negative.       Objective:   Physical Exam  Vitals reviewed. Constitutional: He is oriented to person, place, and time. He appears well-developed and well-nourished. No distress.  HENT:  Head: Normocephalic and atraumatic.  Mouth/Throat: Oropharynx is clear and moist. No oropharyngeal exudate.  Eyes: Conjunctivae are normal. Right eye exhibits no discharge. Left eye exhibits no discharge. No scleral icterus.  Neck: Normal range of motion. Neck supple. No JVD present. No tracheal deviation present. No thyromegaly present.  Cardiovascular: Normal rate, regular rhythm, normal heart sounds and intact distal pulses.  Exam reveals no gallop and no friction rub.   No murmur heard. Pulmonary/Chest: Effort normal and breath sounds normal. No stridor. No respiratory distress. He has no wheezes. He has no rales. He exhibits no tenderness.  Abdominal: Soft. Bowel sounds are normal. He exhibits no distension and no mass. There is no tenderness. There is no rebound and no  guarding.  Musculoskeletal: Normal range of motion. He exhibits no edema and no tenderness.  Lymphadenopathy:    He has no cervical adenopathy.  Neurological: He is oriented to person, place, and time.  Skin: Skin is warm and dry. No rash noted. He is not diaphoretic. No erythema. No pallor.  Psychiatric: He has a normal mood and affect. His behavior is normal. Judgment and thought content normal.    Lab Results  Component Value Date   WBC 5.2 06/05/2012   HGB 16.7 06/05/2012   HCT 49.3 06/05/2012   PLT 241.0 06/05/2012   GLUCOSE 90 06/05/2012   CHOL 179 06/05/2012   TRIG 59.0 06/05/2012   HDL 47.40 06/05/2012   LDLCALC 120* 06/05/2012   ALT 24 06/05/2012   AST 25 06/05/2012   NA 141 06/05/2012   K 4.4 06/05/2012   CL 105 06/05/2012   CREATININE 1.0 06/05/2012   BUN 22 06/05/2012   CO2 29 06/05/2012   TSH 2.63 06/05/2012   PSA 1.98 06/05/2012   HGBA1C 5.5 06/05/2012        Assessment & Plan:

## 2012-12-21 ENCOUNTER — Encounter: Payer: Self-pay | Admitting: Internal Medicine

## 2012-12-21 ENCOUNTER — Other Ambulatory Visit (INDEPENDENT_AMBULATORY_CARE_PROVIDER_SITE_OTHER): Payer: BC Managed Care – PPO

## 2012-12-21 DIAGNOSIS — I10 Essential (primary) hypertension: Secondary | ICD-10-CM

## 2012-12-21 LAB — BASIC METABOLIC PANEL
CO2: 29 mEq/L (ref 19–32)
Chloride: 101 mEq/L (ref 96–112)
Creatinine, Ser: 0.9 mg/dL (ref 0.4–1.5)
Sodium: 137 mEq/L (ref 135–145)

## 2013-02-23 ENCOUNTER — Other Ambulatory Visit: Payer: Self-pay | Admitting: Internal Medicine

## 2013-03-08 ENCOUNTER — Telehealth: Payer: Self-pay

## 2013-03-08 DIAGNOSIS — E291 Testicular hypofunction: Secondary | ICD-10-CM

## 2013-03-08 MED ORDER — TESTOSTERONE 20.25 MG/ACT (1.62%) TD GEL: 2.0000 | Freq: Every day | TRANSDERMAL | Status: DC

## 2013-03-08 NOTE — Telephone Encounter (Signed)
OK to fill this prescription with additional refills x0 Needs OV w/Dr Yetta Barre Thank you!

## 2013-03-08 NOTE — Telephone Encounter (Signed)
Faxed to pharmacy

## 2013-03-08 NOTE — Telephone Encounter (Signed)
Received [facility]ill request [facility]  requesting refills for Androgel 20.25 mg . Rx last written 10/26/12 and pt last seen 12/14/12 . Please advise Thanks

## 2013-04-01 ENCOUNTER — Other Ambulatory Visit: Payer: Self-pay

## 2013-06-17 ENCOUNTER — Other Ambulatory Visit (INDEPENDENT_AMBULATORY_CARE_PROVIDER_SITE_OTHER): Payer: BC Managed Care – PPO

## 2013-06-17 ENCOUNTER — Encounter: Payer: Self-pay | Admitting: Internal Medicine

## 2013-06-17 ENCOUNTER — Ambulatory Visit (INDEPENDENT_AMBULATORY_CARE_PROVIDER_SITE_OTHER): Payer: BC Managed Care – PPO | Admitting: Internal Medicine

## 2013-06-17 ENCOUNTER — Encounter: Payer: Self-pay | Admitting: Gastroenterology

## 2013-06-17 VITALS — BP 142/86 | HR 86 | Temp 97.5°F | Resp 16 | Ht 71.0 in | Wt 200.0 lb

## 2013-06-17 DIAGNOSIS — I10 Essential (primary) hypertension: Secondary | ICD-10-CM

## 2013-06-17 DIAGNOSIS — Z0000 Encounter for general adult medical examination without abnormal findings: Secondary | ICD-10-CM

## 2013-06-17 DIAGNOSIS — B009 Herpesviral infection, unspecified: Secondary | ICD-10-CM

## 2013-06-17 DIAGNOSIS — B001 Herpesviral vesicular dermatitis: Secondary | ICD-10-CM | POA: Insufficient documentation

## 2013-06-17 LAB — URINALYSIS, ROUTINE W REFLEX MICROSCOPIC
Bilirubin Urine: NEGATIVE
Hgb urine dipstick: NEGATIVE
KETONES UR: NEGATIVE
LEUKOCYTES UA: NEGATIVE
Nitrite: NEGATIVE
RBC / HPF: NONE SEEN (ref 0–?)
SPECIFIC GRAVITY, URINE: 1.02 (ref 1.000–1.030)
Total Protein, Urine: NEGATIVE
URINE GLUCOSE: NEGATIVE
Urobilinogen, UA: 0.2 (ref 0.0–1.0)
pH: 8 (ref 5.0–8.0)

## 2013-06-17 LAB — LIPID PANEL
Cholesterol: 184 mg/dL (ref 0–200)
HDL: 43.5 mg/dL (ref >39.00)
LDL Cholesterol: 129 mg/dL — ABNORMAL HIGH (ref 0–99)
Total CHOL/HDL Ratio: 4
Triglycerides: 58 mg/dL (ref 0.0–149.0)
VLDL: 11.6 mg/dL (ref 0.0–40.0)

## 2013-06-17 LAB — CBC WITH DIFFERENTIAL/PLATELET
Basophils Absolute: 0 10*3/uL (ref 0.0–0.1)
Basophils Relative: 0.7 % (ref 0.0–3.0)
EOS ABS: 0.3 10*3/uL (ref 0.0–0.7)
Eosinophils Relative: 7.4 % — ABNORMAL HIGH (ref 0.0–5.0)
HEMATOCRIT: 46 % (ref 39.0–52.0)
HEMOGLOBIN: 15.7 g/dL (ref 13.0–17.0)
LYMPHS ABS: 1.4 10*3/uL (ref 0.7–4.0)
LYMPHS PCT: 31.7 % (ref 12.0–46.0)
MCHC: 34.2 g/dL (ref 30.0–36.0)
MCV: 89.7 fl (ref 78.0–100.0)
Monocytes Absolute: 0.4 10*3/uL (ref 0.1–1.0)
Monocytes Relative: 9.9 % (ref 3.0–12.0)
NEUTROS ABS: 2.2 10*3/uL (ref 1.4–7.7)
Neutrophils Relative %: 50.3 % (ref 43.0–77.0)
Platelets: 240 10*3/uL (ref 150.0–400.0)
RBC: 5.12 Mil/uL (ref 4.22–5.81)
RDW: 13.1 % (ref 11.5–14.6)
WBC: 4.4 10*3/uL — ABNORMAL LOW (ref 4.5–10.5)

## 2013-06-17 LAB — PSA: PSA: 0.5 ng/mL (ref 0.10–4.00)

## 2013-06-17 LAB — COMPREHENSIVE METABOLIC PANEL
ALK PHOS: 47 U/L (ref 39–117)
ALT: 22 U/L (ref 0–53)
AST: 21 U/L (ref 0–37)
Albumin: 4 g/dL (ref 3.5–5.2)
BUN: 23 mg/dL (ref 6–23)
CALCIUM: 9.6 mg/dL (ref 8.4–10.5)
CHLORIDE: 105 meq/L (ref 96–112)
CO2: 31 mEq/L (ref 19–32)
CREATININE: 1 mg/dL (ref 0.4–1.5)
GFR: 82.93 mL/min (ref >60.00)
Glucose, Bld: 97 mg/dL (ref 70–99)
Potassium: 4 mEq/L (ref 3.5–5.1)
Sodium: 141 mEq/L (ref 135–145)
Total Bilirubin: 0.8 mg/dL (ref 0.3–1.2)
Total Protein: 7.2 g/dL (ref 6.0–8.3)

## 2013-06-17 LAB — TSH: TSH: 1.8 u[IU]/mL (ref 0.35–5.50)

## 2013-06-17 LAB — FECAL OCCULT BLOOD, GUAIAC: Fecal Occult Blood: NEGATIVE

## 2013-06-17 MED ORDER — VALACYCLOVIR HCL 1 G PO TABS
1000.0000 mg | ORAL_TABLET | Freq: Two times a day (BID) | ORAL | Status: DC
Start: 2013-06-17 — End: 2014-07-25

## 2013-06-17 NOTE — Assessment & Plan Note (Signed)
His BP is well controlled His lytes and renal function are normal 

## 2013-06-17 NOTE — Patient Instructions (Signed)
Health Maintenance, Males A healthy lifestyle and preventative care can promote health and wellness.  Maintain regular health, dental, and eye exams.  Eat a healthy diet. Foods like vegetables, fruits, whole grains, low-fat dairy products, and lean protein foods contain the nutrients you need and are low in calories. Decrease your intake of foods high in solid fats, added sugars, and salt. Get information about a proper diet from your health care provider, if necessary.  Regular physical exercise is one of the most important things you can do for your health. Most adults should get at least 150 minutes of moderate-intensity exercise (any activity that increases your heart rate and causes you to sweat) each week. In addition, most adults need muscle-strengthening exercises on 2 or more days a week.   Maintain a healthy weight. The body mass index (BMI) is a screening tool to identify possible weight problems. It provides an estimate of body fat based on height and weight. Your health care provider can find your BMI and can help you achieve or maintain a healthy weight. For males 20 years and older:  A BMI below 18.5 is considered underweight.  A BMI of 18.5 to 24.9 is normal.  A BMI of 25 to 29.9 is considered overweight.  A BMI of 30 and above is considered obese.  Maintain normal blood lipids and cholesterol by exercising and minimizing your intake of saturated fat. Eat a balanced diet with plenty of fruits and vegetables. Blood tests for lipids and cholesterol should begin at age 20 and be repeated every 5 years. If your lipid or cholesterol levels are high, you are over 50, or you are at high risk for heart disease, you may need your cholesterol levels checked more frequently.Ongoing high lipid and cholesterol levels should be treated with medicines, if diet and exercise are not working.  If you smoke, find out from your health care provider how to quit. If you do not use tobacco, do not  start.  Lung cancer screening is recommended for adults aged 55 80 years who are at high risk for developing lung cancer because of a history of smoking. A yearly low-dose CT scan of the lungs is recommended for people who have at least a 30-pack-year history of smoking and are a current smoker or have quit within the past 15 years. A pack year of smoking is smoking an average of 1 pack of cigarettes a day for 1 year (for example, a 30-pack-year history of smoking could mean smoking 1 pack a day for 30 years or 2 packs a day for 15 years). Yearly screening should continue until the smoker has stopped smoking for at least 15 years. Yearly screening should be stopped for people who develop a health problem that would prevent them from having lung cancer treatment.  If you choose to drink alcohol, do not have more than 2 drinks per day. One drink is considered to be 12 oz (360 mL) of beer, 5 oz (150 mL) of wine, or 1.5 oz (45 mL) of liquor.  Avoid use of street drugs. Do not share needles with anyone. Ask for help if you need support or instructions about stopping the use of drugs.  High blood pressure causes heart disease and increases the risk of stroke. Blood pressure should be checked at least every 1 2 years. Ongoing high blood pressure should be treated with medicines if weight loss and exercise are not effective.  If you are 45 51 years old, ask your health   care provider if you should take aspirin to prevent heart disease.  Diabetes screening involves taking a blood sample to check your fasting blood sugar level. This should be done once every 3 years after age 45, if you are at a normal weight and without risk factors for diabetes. Testing should be considered at a younger age or be carried out more frequently if you are overweight and have at least 1 risk factor for diabetes.  Colorectal cancer can be detected and often prevented. Most routine colorectal cancer screening begins at the age of 50  and continues through age 75. However, your health care provider may recommend screening at an earlier age if you have risk factors for colon cancer. On a yearly basis, your health care provider may provide home test kits to check for hidden blood in the stool. A small camera at the end of a tube may be used to directly examine the colon (sigmoidoscopy or colonoscopy) to detect the earliest forms of colorectal cancer. Talk to your health care provider about this at age 50, when routine screening begins. A direct exam of the colon should be repeated every 5 10 years through age 75, unless early forms of pre-cancerous polyps or small growths are found.  People who are at an increased risk for hepatitis B should be screened for this virus. You are considered at high risk for hepatitis B if:  You were born in a country where hepatitis B occurs often. Talk with your health care provider about which countries are considered high-risk.  Your parents were born in a high-risk country and you have not received a shot to protect against hepatitis B (hepatitis B vaccine).  You have HIV or AIDS.  You use needles to inject street drugs.  You live with, or have sex with, someone who has hepatitis B.  You are a man who has sex with other men (MSM).  You get hemodialysis treatment.  You take certain medicines for conditions like cancer, organ transplantation, and autoimmune conditions.  Hepatitis C blood testing is recommended for all people born from 1945 through 1965 and any individual with known risk factors for hepatitis C.  Healthy men should no longer receive prostate-specific antigen (PSA) blood tests as part of routine cancer screening. Talk to your health care provider about prostate cancer screening.  Testicular cancer screening is not recommended for adolescents or adult males who have no symptoms. Screening includes self-exam, a health care provider exam, and other screening tests. Consult with  your health care provider about any symptoms you have or any concerns you have about testicular cancer.  Practice safe sex. Use condoms and avoid high-risk sexual practices to reduce the spread of sexually transmitted infections (STIs).  Use sunscreen. Apply sunscreen liberally and repeatedly throughout the day. You should seek shade when your shadow is shorter than you. Protect yourself by wearing long sleeves, pants, a wide-brimmed hat, and sunglasses year round, whenever you are outdoors.  Tell your health care provider of new moles or changes in moles, especially if there is a change in shape or color. Also tell your provider if a mole is larger than the size of a pencil eraser.  A one-time screening for abdominal aortic aneurysm (AAA) and surgical repair of large AAAs by ultrasound is recommended for men aged 65 75 years who are current or former smokers.  Stay current with your vaccines (immunizations). Document Released: 11/09/2007 Document Revised: 03/03/2013 Document Reviewed: 10/08/2010 ExitCare Patient Information 2014 [employer].   Hypertension As your heart beats, it forces blood through your arteries. This force is your blood pressure. If the pressure is too high, it is called hypertension (HTN) or high blood pressure. HTN is dangerous because you may have it and not know it. High blood pressure may mean that your heart has to work harder to pump blood. Your arteries may be narrow or stiff. The extra work puts you at risk for heart disease, stroke, and other problems.  Blood pressure consists of two numbers, a higher number over a lower, 110/72, for example. It is stated as "110 over 72." The ideal is below 120 for the top number (systolic) and under 80 for the bottom (diastolic). Write down your blood pressure today. You should pay close attention to your blood pressure if you have certain conditions such as:  Heart failure.  Prior heart attack.  Diabetes  Chronic kidney  disease.  Prior stroke.  Multiple risk factors for heart disease. To see if you have HTN, your blood pressure should be measured while you are seated with your arm held at the level of the heart. It should be measured at least twice. A one-time elevated blood pressure reading (especially in the Emergency Department) does not mean that you need treatment. There may be conditions in which the blood pressure is different between your right and left arms. It is important to see your caregiver soon for a recheck. Most people have essential hypertension which means that there is not a specific cause. This type of high blood pressure may be lowered by changing lifestyle factors such as:  Stress.  Smoking.  Lack of exercise.  Excessive weight.  Drug/tobacco/alcohol use.  Eating less salt. Most people do not have symptoms from high blood pressure until it has caused damage to the body. Effective treatment can often prevent, delay or reduce that damage. TREATMENT  When a cause has been identified, treatment for high blood pressure is directed at the cause. There are a large number of medications to treat HTN. These fall into several categories, and your caregiver will help you select the medicines that are best for you. Medications may have side effects. You should review side effects with your caregiver. If your blood pressure stays high after you have made lifestyle changes or started on medicines,   Your medication(s) may need to be changed.  Other problems may need to be addressed.  Be certain you understand your prescriptions, and know how and when to take your medicine.  Be sure to follow up with your caregiver within the time frame advised (usually within two weeks) to have your blood pressure rechecked and to review your medications.  If you are taking more than one medicine to lower your blood pressure, make sure you know how and at what times they should be taken. Taking two medicines  at the same time can result in blood pressure that is too low. SEEK IMMEDIATE MEDICAL CARE IF:  You develop a severe headache, blurred or changing vision, or confusion.  You have unusual weakness or numbness, or a faint feeling.  You have severe chest or abdominal pain, vomiting, or breathing problems. MAKE SURE YOU:   Understand these instructions.  Will watch your condition.  Will get help right away if you are not doing well or get worse. Document Released: 05/13/2005 Document Revised: 08/05/2011 Document Reviewed: 01/01/2008 Exit[employer] Patient Information 2014 [employer].  

## 2013-06-17 NOTE — Progress Notes (Signed)
Pre visit review using our clinic review tool, if applicable. No additional management support is needed unless otherwise documented below in the visit note. 

## 2013-06-17 NOTE — Assessment & Plan Note (Signed)
Will treat this with valtrex

## 2013-06-17 NOTE — Progress Notes (Signed)
Subjective:    Patient ID: Lawrence Smith, male    DOB: [birth year], 53 y.o.   MRN: [id number]  Hypertension This is a chronic problem. The current episode started more than 1 year ago. The problem has been gradually improving since onset. The problem is controlled. Pertinent negatives include no anxiety, blurred vision, chest pain, headaches, malaise/fatigue, neck pain, orthopnea, palpitations, peripheral edema, PND, shortness of breath or sweats. There are no associated agents to hypertension. Past treatments include angiotensin blockers and diuretics. The current treatment provides moderate improvement. There are no compliance problems.       Review of Systems  Constitutional: Negative.  Negative for fever, chills, malaise/fatigue, diaphoresis, appetite change and fatigue.  HENT: Negative.   Eyes: Negative.  Negative for blurred vision.  Respiratory: Negative.  Negative for apnea, cough, choking, chest tightness, shortness of breath, wheezing and stridor.   Cardiovascular: Negative.  Negative for chest pain, palpitations, orthopnea, leg swelling and PND.  Gastrointestinal: Negative.  Negative for nausea, vomiting, abdominal pain, diarrhea, constipation and blood in stool.  Endocrine: Negative.   Genitourinary: Negative.   Musculoskeletal: Negative.  Negative for neck pain.  Skin: Negative.        He gets cold sores around his lips and wants a medication to treat this, he gets an outbreak about once every 1-2 months.  Allergic/Immunologic: Negative.   Neurological: Negative.  Negative for dizziness, tremors, syncope, speech difficulty, weakness, light-headedness and headaches.  Hematological: Negative.  Negative for adenopathy. Does not bruise/bleed easily.  Psychiatric/Behavioral: Negative for behavioral problems and agitation.       Objective:   Physical Exam  Vitals reviewed. Constitutional: He is oriented to person, place, and time. He appears well-developed and well-nourished.  No distress.  HENT:  Head: Normocephalic and atraumatic.  Mouth/Throat: Oropharynx is clear and moist. No oropharyngeal exudate.  Eyes: Conjunctivae are normal. Right eye exhibits no discharge. Left eye exhibits no discharge. No scleral icterus.  Neck: Normal range of motion. Neck supple. No JVD present. No tracheal deviation present. No thyromegaly present.  Cardiovascular: Normal rate, regular rhythm, normal heart sounds and intact distal pulses.  Exam reveals no gallop and no friction rub.   No murmur heard. Pulmonary/Chest: Effort normal and breath sounds normal. No stridor. No respiratory distress. He has no wheezes. He has no rales. He exhibits no tenderness.  Abdominal: Soft. Bowel sounds are normal. He exhibits no distension and no mass. There is no tenderness. There is no rebound and no guarding. Hernia confirmed negative in the right inguinal area and confirmed negative in the left inguinal area.  Genitourinary: Rectum normal, prostate normal, testes normal and penis normal. Rectal exam shows no external hemorrhoid, no internal hemorrhoid, no fissure, no mass, no tenderness and anal tone normal. Guaiac negative stool. Prostate is not enlarged and not tender. Right testis shows no mass, no swelling and no tenderness. Right testis is descended. Left testis shows no mass, no swelling and no tenderness. Left testis is descended. Circumcised. No penile erythema or penile tenderness. No discharge found.  Musculoskeletal: Normal range of motion. He exhibits no edema and no tenderness.  Lymphadenopathy:    He has no cervical adenopathy.       Right: No inguinal adenopathy present.       Left: No inguinal adenopathy present.  Neurological: He is oriented to person, place, and time.  Skin: Skin is warm and dry. No rash noted. He is not diaphoretic. No erythema. No pallor.  Psychiatric: He has a  normal mood and affect. His behavior is normal. Judgment and thought content normal.     Lab Results    Component Value Date   WBC 5.2 06/05/2012   HGB 16.7 06/05/2012   HCT 49.3 06/05/2012   PLT 241.0 06/05/2012   GLUCOSE 90 12/21/2012   CHOL 179 06/05/2012   TRIG 59.0 06/05/2012   HDL 47.40 06/05/2012   LDLCALC 120* 06/05/2012   ALT 24 06/05/2012   AST 25 06/05/2012   NA 137 12/21/2012   K 3.8 12/21/2012   CL 101 12/21/2012   CREATININE 0.9 12/21/2012   BUN 16 12/21/2012   CO2 29 12/21/2012   TSH 2.63 06/05/2012   PSA 1.98 06/05/2012   HGBA1C 5.5 06/05/2012       Assessment & Plan:

## 2013-06-17 NOTE — Assessment & Plan Note (Signed)
Exam done Vaccines were reviewed Labs ordered He was referred for a colonoscopy Pt ed material was given

## 2013-07-19 ENCOUNTER — Ambulatory Visit (AMBULATORY_SURGERY_CENTER): Payer: Self-pay | Admitting: *Deleted

## 2013-07-19 VITALS — Ht 70.0 in | Wt 201.4 lb

## 2013-07-19 DIAGNOSIS — Z1211 Encounter for screening for malignant neoplasm of colon: Secondary | ICD-10-CM

## 2013-07-19 MED ORDER — MOVIPREP 100 G PO SOLR: 1.0000 | Freq: Once | ORAL | Status: DC

## 2013-07-19 NOTE — Progress Notes (Signed)
No egg or soy allergy. ewm No problems with  past sedation with dental procedure.Marland Kitchen ewm No home 02 use or cpap. ewm Pt declined emmi. ewm

## 2013-07-22 ENCOUNTER — Encounter: Payer: Self-pay | Admitting: Gastroenterology

## 2013-07-27 ENCOUNTER — Ambulatory Visit (AMBULATORY_SURGERY_CENTER): Payer: BC Managed Care – PPO | Admitting: Gastroenterology

## 2013-07-27 ENCOUNTER — Encounter: Payer: Self-pay | Admitting: Gastroenterology

## 2013-07-27 VITALS — BP 114/77 | HR 51 | Temp 97.4°F | Resp 14 | Ht 70.0 in | Wt 201.0 lb

## 2013-07-27 DIAGNOSIS — Z1211 Encounter for screening for malignant neoplasm of colon: Secondary | ICD-10-CM

## 2013-07-27 LAB — HM COLONOSCOPY

## 2013-07-27 MED ORDER — SODIUM CHLORIDE 0.9 % IV SOLN: 500.0000 mL | INTRAVENOUS | Status: DC

## 2013-07-27 NOTE — Progress Notes (Signed)
Lidocaine-40mg IV prior to Propofol InductionPropofol given over incremental dosages 

## 2013-07-27 NOTE — Op Note (Signed)
[facility]  [address] [place], [address]   COLONOSCOPY PROCEDURE REPORT  PATIENT: Lawrence Smith, Lawrence Smith  MR#: [id number] BIRTHDATE: [birth year] , 52  yrs. old GENDER: Male ENDOSCOPIST: Milus Banister, MD REFERRED BY: Scarlette Calico, MD PROCEDURE DATE:  07/27/2013 PROCEDURE:   Colonoscopy, screening First Screening Colonoscopy - Avg.  risk and is 50 yrs.  old or older Yes.  Prior Negative Screening - Now for repeat screening. N/A  History of Adenoma - Now for follow-up colonoscopy & has been > or = to 3 yrs.  N/A  Polyps Removed Today? No.  Recommend repeat exam, <10 yrs? No. ASA CLASS:   Class II INDICATIONS:average risk screening. MEDICATIONS: propofol (Diprivan) 300mg  IV and MAC sedation, administered by CRNA  DESCRIPTION OF PROCEDURE:   After the risks benefits and alternatives of the procedure were thoroughly explained, informed consent was obtained.  A digital rectal exam revealed no abnormalities of the rectum.   The [id number]  endoscope was introduced through the anus and advanced to the cecum, which was identified by both the appendix and ileocecal valve. No adverse events experienced.   The quality of the prep was excellent.  The instrument was then slowly withdrawn as the colon was fully examined.   COLON FINDINGS: A normal appearing cecum, ileocecal valve, and appendiceal orifice were identified.  The ascending, hepatic flexure, transverse, splenic flexure, descending, sigmoid colon and rectum appeared unremarkable.  No polyps or cancers were seen. Retroflexed views revealed no abnormalities. The time to cecum=3 minutes 30 seconds.  Withdrawal time=9 minutes 07 seconds.  The scope was withdrawn and the procedure completed. COMPLICATIONS: There were no complications.  ENDOSCOPIC IMPRESSION: Normal colon No polyps or cancers  RECOMMENDATIONS: You should continue to follow colorectal cancer screening guidelines for "routine risk"  patients with a repeat colonoscopy in 10 years.   eSigned:  Milus Banister, MD 07/27/2013 10:00 AM

## 2013-07-27 NOTE — Patient Instructions (Signed)
YOU HAD AN ENDOSCOPIC PROCEDURE TODAY AT THE [facility]: Refer to the procedure report that was given to you for any specific questions about what was found during the examination.  If the procedure report does not answer your questions, please call your gastroenterologist to clarify.  If you requested that your care partner not be given the details of your procedure findings, then the procedure report has been included in a sealed envelope for you to review at your convenience later.  YOU SHOULD EXPECT: Some feelings of bloating in the abdomen. Passage of more gas than usual.  Walking can help get rid of the air that was put into your GI tract during the procedure and reduce the bloating. If you had a lower endoscopy (such as a colonoscopy or flexible sigmoidoscopy) you may notice spotting of blood in your stool or on the toilet paper. If you underwent a bowel prep for your procedure, then you may not have a normal bowel movement for a few days.  DIET: Your first meal following the procedure should be a light meal and then it is ok to progress to your normal diet.  A half-sandwich or bowl of soup is an example of a good first meal.  Heavy or fried foods are harder to digest and may make you feel nauseous or bloated.  Likewise meals heavy in dairy and vegetables can cause extra gas to form and this can also increase the bloating.  Drink plenty of fluids but you should avoid alcoholic beverages for 24 hours.  ACTIVITY: Your care partner should take you home directly after the procedure.  You should plan to take it easy, moving slowly for the rest of the day.  You can resume normal activity the day after the procedure however you should NOT DRIVE or use heavy machinery for 24 hours (because of the sedation medicines used during the test).    SYMPTOMS TO REPORT IMMEDIATELY: A gastroenterologist can be reached at any hour.  During normal business hours, 8:30 AM to 5:00 PM Monday through Friday,  call [phone].  After hours and on weekends, please call the GI answering service at [phone] who will take a message and have the physician on call contact you.   Following lower endoscopy (colonoscopy or flexible sigmoidoscopy):  Excessive amounts of blood in the stool  Significant tenderness or worsening of abdominal pains  Swelling of the abdomen that is new, acute  Fever of 100F or higher  FOLLOW UP: If any biopsies were taken you will be contacted by phone or by letter within the next 1-3 weeks.  Call your gastroenterologist if you have not heard about the biopsies in 3 weeks.  Our staff will call the home number listed on your records the next business day following your procedure to check on you and address any questions or concerns that you may have at that time regarding the information given to you following your procedure. This is a courtesy call and so if there is no answer at the home number and we have not heard from you through the emergency physician on call, we will assume that you have returned to your regular daily activities without incident.  SIGNATURES/CONFIDENTIALITY: You and/or your care partner have signed paperwork which will be entered into your electronic medical record.  These signatures attest to the fact that that the information above on your After Visit Summary has been reviewed and is understood.  Full responsibility of the confidentiality of this   discharge information lies with you and/or your care-partner.  Resume medications. 

## 2013-07-28 ENCOUNTER — Telehealth: Payer: Self-pay | Admitting: *Deleted

## 2013-07-28 DIAGNOSIS — E291 Testicular hypofunction: Secondary | ICD-10-CM

## 2013-07-28 MED ORDER — TESTOSTERONE 20.25 MG/ACT (1.62%) TD GEL: 2.0000 | Freq: Every day | TRANSDERMAL | Status: DC

## 2013-07-28 NOTE — Telephone Encounter (Signed)
Patient phoned requesting refill on androgel.  Last OV with PCP 06/17/13 and last PSA 06/17/13.  Refilled per protocol and notified patient.

## 2013-07-28 NOTE — Telephone Encounter (Signed)
  Follow up Call-  Call back number 07/27/2013  Post procedure Call Back phone  # [phone]  Permission to leave phone message Yes     Patient questions:  Do you have a fever, pain , or abdominal swelling? no Pain Score  0 *  Have you tolerated food without any problems? yes  Have you been able to return to your normal activities? yes  Do you have any questions about your discharge instructions: Diet   no Medications  no Follow up visit  no  Do you have questions or concerns about your Care? no  Actions: * If pain score is 4 or above: No action needed, pain <4.

## 2013-07-30 ENCOUNTER — Other Ambulatory Visit: Payer: Self-pay | Admitting: *Deleted

## 2013-07-30 DIAGNOSIS — E291 Testicular hypofunction: Secondary | ICD-10-CM

## 2013-07-30 MED ORDER — TESTOSTERONE 20.25 MG/ACT (1.62%) TD GEL: 2.0000 | Freq: Every day | TRANSDERMAL | Status: DC

## 2013-07-30 NOTE — Telephone Encounter (Signed)
Patient phoned stating pharmacy had not received androgel script.  Printing to resend

## 2013-08-04 ENCOUNTER — Telehealth: Payer: Self-pay | Admitting: *Deleted

## 2013-08-04 NOTE — Telephone Encounter (Signed)
Patient phoned stating [facility] was stating they still had not received androgel refill request.  Per faxed copy, previous fax was sent 07/30/13 at 1146.  Phoned pharmacy and provided verbal order to Emi Belfast and notified patient.

## 2013-09-10 ENCOUNTER — Other Ambulatory Visit: Payer: Self-pay | Admitting: Internal Medicine

## 2013-11-03 ENCOUNTER — Telehealth: Payer: Self-pay | Admitting: Internal Medicine

## 2013-11-03 DIAGNOSIS — E291 Testicular hypofunction: Secondary | ICD-10-CM

## 2013-11-03 MED ORDER — TESTOSTERONE 20.25 MG/ACT (1.62%) TD GEL: 2.0000 | Freq: Every day | TRANSDERMAL | Status: DC

## 2013-11-03 NOTE — Telephone Encounter (Signed)
RX faxed to [facility]

## 2013-11-03 NOTE — Telephone Encounter (Signed)
Patient called to request refill on Androgel.  Send to his mail order pharmacy/[facility].  He has an appt on December 16, 2013.

## 2013-11-03 NOTE — Telephone Encounter (Signed)
done

## 2013-12-09 ENCOUNTER — Other Ambulatory Visit: Payer: Self-pay | Admitting: Internal Medicine

## 2013-12-16 ENCOUNTER — Ambulatory Visit: Payer: BC Managed Care – PPO | Admitting: Internal Medicine

## 2013-12-23 ENCOUNTER — Encounter: Payer: Self-pay | Admitting: Internal Medicine

## 2013-12-23 ENCOUNTER — Ambulatory Visit (INDEPENDENT_AMBULATORY_CARE_PROVIDER_SITE_OTHER): Payer: BC Managed Care – PPO | Admitting: Internal Medicine

## 2013-12-23 VITALS — BP 120/84 | HR 91 | Temp 97.9°F | Resp 16 | Ht 70.0 in | Wt 198.8 lb

## 2013-12-23 DIAGNOSIS — E291 Testicular hypofunction: Secondary | ICD-10-CM

## 2013-12-23 DIAGNOSIS — I10 Essential (primary) hypertension: Secondary | ICD-10-CM

## 2013-12-23 DIAGNOSIS — Z87442 Personal history of urinary calculi: Secondary | ICD-10-CM

## 2013-12-23 DIAGNOSIS — F528 Other sexual dysfunction not due to a substance or known physiological condition: Secondary | ICD-10-CM

## 2013-12-23 NOTE — Assessment & Plan Note (Signed)
His BP is well controlled 

## 2013-12-23 NOTE — Patient Instructions (Signed)

## 2013-12-23 NOTE — Progress Notes (Signed)
Pre visit review using our clinic review tool, if applicable. No additional management support is needed unless otherwise documented below in the visit note. 

## 2013-12-23 NOTE — Progress Notes (Signed)
   Subjective:    Patient ID: Lawrence Smith, male    DOB: 09-12-1960, 53 y.o.   MRN: [id number]  Hypertension This is a chronic problem. The current episode started more than 1 year ago. The problem has been rapidly improving since onset. The problem is controlled. Pertinent negatives include no anxiety, blurred vision, chest pain, headaches, malaise/fatigue, neck pain, orthopnea, palpitations, peripheral edema, PND, shortness of breath or sweats. There are no associated agents to hypertension. Past treatments include angiotensin blockers and diuretics. The current treatment provides moderate improvement. There are no compliance problems.       Review of Systems  Constitutional: Negative for malaise/fatigue.  Eyes: Negative for blurred vision.  Respiratory: Negative for shortness of breath.   Cardiovascular: Negative for chest pain, palpitations, orthopnea and PND.  Musculoskeletal: Negative for neck pain.  Neurological: Negative for headaches.  All other systems reviewed and are negative.      Objective:   Physical Exam  Vitals reviewed. Constitutional: He is oriented to person, place, and time. He appears well-developed and well-nourished. No distress.  HENT:  Head: Normocephalic and atraumatic.  Mouth/Throat: Oropharynx is clear and moist. No oropharyngeal exudate.  Eyes: Conjunctivae are normal. Right eye exhibits no discharge. Left eye exhibits no discharge. No scleral icterus.  Neck: Normal range of motion. Neck supple. No JVD present. No tracheal deviation present. No thyromegaly present.  Cardiovascular: Normal rate, regular rhythm, normal heart sounds and intact distal pulses.  Exam reveals no gallop and no friction rub.   No murmur heard. Pulmonary/Chest: Effort normal and breath sounds normal. No stridor. No respiratory distress. He has no wheezes. He has no rales. He exhibits no tenderness.  Abdominal: Soft. Bowel sounds are normal. He exhibits no distension and no mass.  There is no tenderness. There is no rebound and no guarding.  Musculoskeletal: Normal range of motion. He exhibits no edema and no tenderness.  Lymphadenopathy:    He has no cervical adenopathy.  Neurological: He is oriented to person, place, and time.  Skin: Skin is warm and dry. No rash noted. He is not diaphoretic. No erythema. No pallor.          Assessment & Plan:

## 2014-01-09 ENCOUNTER — Other Ambulatory Visit: Payer: Self-pay | Admitting: Internal Medicine

## 2014-02-09 ENCOUNTER — Other Ambulatory Visit: Payer: Self-pay | Admitting: Internal Medicine

## 2014-03-17 ENCOUNTER — Other Ambulatory Visit: Payer: Self-pay

## 2014-03-17 MED ORDER — VALSARTAN-HYDROCHLOROTHIAZIDE 320-12.5 MG PO TABS: ORAL_TABLET | ORAL | Status: DC

## 2014-03-28 ENCOUNTER — Telehealth: Payer: Self-pay | Admitting: Internal Medicine

## 2014-03-28 DIAGNOSIS — E291 Testicular hypofunction: Secondary | ICD-10-CM

## 2014-03-28 MED ORDER — TESTOSTERONE 20.25 MG/ACT (1.62%) TD GEL: 2.0000 | Freq: Every day | TRANSDERMAL | Status: DC

## 2014-03-28 NOTE — Telephone Encounter (Signed)
Patient is requesting refill script of androgel sent to [facility]

## 2014-03-28 NOTE — Telephone Encounter (Signed)
done

## 2014-03-29 ENCOUNTER — Other Ambulatory Visit: Payer: Self-pay | Admitting: Internal Medicine

## 2014-03-29 NOTE — Telephone Encounter (Signed)
Notified pt rx has been fax to [facility]...Lawrence Smith

## 2014-04-22 ENCOUNTER — Other Ambulatory Visit: Payer: Self-pay | Admitting: Internal Medicine

## 2014-06-06 ENCOUNTER — Telehealth: Payer: Self-pay | Admitting: Internal Medicine

## 2014-06-06 NOTE — Telephone Encounter (Signed)
Patient states that [facility] was supposed to be calling us regarding cialis pre authorization. Advised that we have no notes of being contacted

## 2014-06-06 NOTE — Telephone Encounter (Signed)
[facility] called to notify that we have to contact insurance company to approve androgel.

## 2014-06-07 ENCOUNTER — Encounter: Payer: Self-pay | Admitting: Internal Medicine

## 2014-06-07 NOTE — Telephone Encounter (Signed)
7 then 1 now

## 2014-06-07 NOTE — Telephone Encounter (Signed)
Please answer the following two questions in order to complete PA for Cialis. Thanks  What was the patient's AUA-SI score prior to beginning therapy?    What is the patient's AUA-SI score now?

## 2014-06-08 NOTE — Telephone Encounter (Signed)
All requested information sent to [facility] for PA, waiting on results of insurance review.

## 2014-06-09 ENCOUNTER — Telehealth: Payer: Self-pay | Admitting: Internal Medicine

## 2014-06-09 NOTE — Telephone Encounter (Signed)
Pt calling to check on  Androgel, [employer] needs PA

## 2014-06-10 NOTE — Telephone Encounter (Signed)
Additional information faxed to [facility] as requested this morning. Decision can take up to 72 hrs per insurance.

## 2014-06-27 ENCOUNTER — Encounter: Payer: Self-pay | Admitting: Internal Medicine

## 2014-06-27 ENCOUNTER — Telehealth: Payer: Self-pay

## 2014-06-27 ENCOUNTER — Ambulatory Visit (INDEPENDENT_AMBULATORY_CARE_PROVIDER_SITE_OTHER): Payer: BLUE CROSS/BLUE SHIELD | Admitting: Internal Medicine

## 2014-06-27 VITALS — BP 126/80 | HR 70 | Temp 97.8°F | Resp 16 | Ht 70.0 in | Wt 201.0 lb

## 2014-06-27 DIAGNOSIS — I10 Essential (primary) hypertension: Secondary | ICD-10-CM

## 2014-06-27 DIAGNOSIS — N522 Drug-induced erectile dysfunction: Secondary | ICD-10-CM

## 2014-06-27 DIAGNOSIS — E291 Testicular hypofunction: Secondary | ICD-10-CM

## 2014-06-27 MED ORDER — TESTOSTERONE 20.25 MG/ACT (1.62%) TD GEL: 2.0000 | Freq: Every day | TRANSDERMAL | Status: DC

## 2014-06-27 MED ORDER — SILDENAFIL CITRATE 100 MG PO TABS: 100.0000 mg | ORAL_TABLET | ORAL | Status: DC | PRN

## 2014-06-27 NOTE — Telephone Encounter (Signed)
Called [employer] medical Director to provide pt testosterone level of 209.30 with reference range, I was advised that a decision will be made within the next few hours.

## 2014-06-27 NOTE — Progress Notes (Signed)
Subjective:    Patient ID: Lawrence Smith, male    DOB: 1960/07/03, 54 y.o.   MRN: [id number]  HPI Comments: He is upset that [facility] did not approve his andro-gel Rx, it appears that they were only aware of his last 3 T levels which were done while he has on T replacement. His diagnosis was made in 2010 when his T= 209. Without T replacement her feels very poorly with fatigue, anxiety, lethargy, depression, and anhedonia.  Hypertension This is a chronic problem. The current episode started more than 1 year ago. The problem is unchanged. The problem is controlled. Pertinent negatives include no anxiety, blurred vision, chest pain, headaches, malaise/fatigue, neck pain, orthopnea, palpitations, peripheral edema, PND, shortness of breath or sweats. Agents associated with hypertension include steroids. Past treatments include angiotensin blockers and diuretics. The current treatment provides significant improvement. There are no compliance problems.       Review of Systems  Constitutional: Positive for fatigue. Negative for fever, chills, malaise/fatigue, diaphoresis and appetite change.  HENT: Negative.   Eyes: Negative.  Negative for blurred vision.  Respiratory: Negative.  Negative for cough, choking, chest tightness, shortness of breath and stridor.   Cardiovascular: Negative.  Negative for chest pain, palpitations, orthopnea, leg swelling and PND.  Gastrointestinal: Negative.  Negative for abdominal pain.  Endocrine: Negative.   Genitourinary: Negative.        He complains of ED  Musculoskeletal: Negative.  Negative for neck pain.  Skin: Negative.   Allergic/Immunologic: Negative.   Neurological: Negative.  Negative for headaches.  Hematological: Negative.  Negative for adenopathy. Does not bruise/bleed easily.  Psychiatric/Behavioral: Negative.        Objective:   Physical Exam  Constitutional: He is oriented to person, place, and time. He appears well-developed and well-nourished.  No distress.  HENT:  Head: Normocephalic and atraumatic.  Mouth/Throat: Oropharynx is clear and moist. No oropharyngeal exudate.  Eyes: Conjunctivae are normal. Right eye exhibits no discharge. Left eye exhibits no discharge. No scleral icterus.  Neck: Normal range of motion. Neck supple. No JVD present. No tracheal deviation present. No thyromegaly present.  Cardiovascular: Normal rate, regular rhythm, normal heart sounds and intact distal pulses.  Exam reveals no gallop and no friction rub.   No murmur heard. Pulmonary/Chest: Effort normal and breath sounds normal. No stridor. No respiratory distress. He has no wheezes. He has no rales. He exhibits no tenderness.  Abdominal: Soft. Bowel sounds are normal. He exhibits no distension. There is no tenderness. There is no rebound and no guarding.  Musculoskeletal: Normal range of motion. He exhibits no edema or tenderness.  Lymphadenopathy:    He has no cervical adenopathy.  Neurological: He is oriented to person, place, and time.  Skin: Skin is warm and dry. No rash noted. He is not diaphoretic. No erythema. No pallor.  Psychiatric: He has a normal mood and affect. His behavior is normal. Judgment and thought content normal.  Vitals reviewed.   Lab Results  Component Value Date   WBC 4.4* 06/17/2013   HGB 15.7 06/17/2013   HCT 46.0 06/17/2013   PLT 240.0 06/17/2013   GLUCOSE 97 06/17/2013   CHOL 184 06/17/2013   TRIG 58.0 06/17/2013   HDL 43.50 06/17/2013   LDLCALC 129* 06/17/2013   ALT 22 06/17/2013   AST 21 06/17/2013   NA 141 06/17/2013   K 4.0 06/17/2013   CL 105 06/17/2013   CREATININE 1.0 06/17/2013   BUN 23 06/17/2013   CO2 31  06/17/2013   TSH 1.80 06/17/2013   PSA 0.50 06/17/2013   HGBA1C 5.5 06/05/2012       Assessment & Plan:

## 2014-06-27 NOTE — Assessment & Plan Note (Signed)
viagra is preferred on his insurance

## 2014-06-27 NOTE — Telephone Encounter (Signed)
Received fax from [place] approved to 05/26/2038, pt notified.

## 2014-06-27 NOTE — Patient Instructions (Signed)

## 2014-06-27 NOTE — Progress Notes (Signed)
Pre visit review using our clinic review tool, if applicable. No additional management support is needed unless otherwise documented below in the visit note. 

## 2014-06-27 NOTE — Assessment & Plan Note (Signed)
Will appeal the decision not to cover the androgel with the T level from 2010, if that does not gain approval then will offer T injection every 2 weeks to help him with the cost

## 2014-06-27 NOTE — Assessment & Plan Note (Signed)
His BP is well controlled 

## 2014-06-28 ENCOUNTER — Telehealth: Payer: Self-pay | Admitting: Internal Medicine

## 2014-06-28 NOTE — Telephone Encounter (Signed)
emmi emailed °

## 2014-07-06 ENCOUNTER — Encounter: Payer: Self-pay | Admitting: Internal Medicine

## 2014-07-07 ENCOUNTER — Other Ambulatory Visit: Payer: Self-pay | Admitting: Internal Medicine

## 2014-07-25 ENCOUNTER — Telehealth: Payer: Self-pay | Admitting: Internal Medicine

## 2014-07-25 NOTE — Telephone Encounter (Signed)
[facility] 

## 2014-07-25 NOTE — Telephone Encounter (Signed)
Pt called in for refill on valACYclovir (VALTREX) 1000 MG tablet [[id number]

## 2014-08-22 ENCOUNTER — Telehealth: Payer: Self-pay

## 2014-08-22 DIAGNOSIS — E291 Testicular hypofunction: Secondary | ICD-10-CM

## 2014-08-22 MED ORDER — TESTOSTERONE 20.25 MG/ACT (1.62%) TD GEL: 2.0000 | Freq: Every day | TRANSDERMAL | Status: DC

## 2014-08-22 NOTE — Telephone Encounter (Signed)
done

## 2014-08-22 NOTE — Telephone Encounter (Signed)
Received refill request from [facility]  request refills for Testosterone (ANDROGEL PUMP) 20.25 MG/ACT (1.62%) GEL .  Thanks

## 2014-09-06 ENCOUNTER — Other Ambulatory Visit: Payer: Self-pay | Admitting: Internal Medicine

## 2014-12-26 ENCOUNTER — Ambulatory Visit (INDEPENDENT_AMBULATORY_CARE_PROVIDER_SITE_OTHER): Payer: BLUE CROSS/BLUE SHIELD | Admitting: Internal Medicine

## 2014-12-26 ENCOUNTER — Other Ambulatory Visit (INDEPENDENT_AMBULATORY_CARE_PROVIDER_SITE_OTHER): Payer: BLUE CROSS/BLUE SHIELD

## 2014-12-26 ENCOUNTER — Encounter: Payer: Self-pay | Admitting: Internal Medicine

## 2014-12-26 VITALS — BP 138/86 | HR 65 | Temp 97.8°F | Resp 16 | Ht 70.0 in | Wt 201.0 lb

## 2014-12-26 DIAGNOSIS — N401 Enlarged prostate with lower urinary tract symptoms: Secondary | ICD-10-CM

## 2014-12-26 DIAGNOSIS — Z0000 Encounter for general adult medical examination without abnormal findings: Secondary | ICD-10-CM

## 2014-12-26 DIAGNOSIS — N138 Other obstructive and reflux uropathy: Secondary | ICD-10-CM

## 2014-12-26 DIAGNOSIS — I10 Essential (primary) hypertension: Secondary | ICD-10-CM

## 2014-12-26 DIAGNOSIS — E291 Testicular hypofunction: Secondary | ICD-10-CM

## 2014-12-26 LAB — COMPREHENSIVE METABOLIC PANEL
ALK PHOS: 49 U/L (ref 39–117)
ALT: 18 U/L (ref 0–53)
AST: 21 U/L (ref 0–37)
Albumin: 4.2 g/dL (ref 3.5–5.2)
BILIRUBIN TOTAL: 0.5 mg/dL (ref 0.2–1.2)
BUN: 24 mg/dL — AB (ref 6–23)
CO2: 27 mEq/L (ref 19–32)
CREATININE: 1 mg/dL (ref 0.40–1.50)
Calcium: 10 mg/dL (ref 8.4–10.5)
Chloride: 103 mEq/L (ref 96–112)
GFR: 83.38 mL/min (ref >60.00)
GLUCOSE: 91 mg/dL (ref 70–99)
POTASSIUM: 4.1 meq/L (ref 3.5–5.1)
SODIUM: 140 meq/L (ref 135–145)
Total Protein: 7.4 g/dL (ref 6.0–8.3)

## 2014-12-26 LAB — CBC WITH DIFFERENTIAL/PLATELET
Basophils Absolute: 0 10*3/uL (ref 0.0–0.1)
Basophils Relative: 0.6 % (ref 0.0–3.0)
EOS ABS: 0.2 10*3/uL (ref 0.0–0.7)
Eosinophils Relative: 5.1 % — ABNORMAL HIGH (ref 0.0–5.0)
HEMATOCRIT: 47.5 % (ref 39.0–52.0)
HEMOGLOBIN: 16.3 g/dL (ref 13.0–17.0)
LYMPHS PCT: 25.2 % (ref 12.0–46.0)
Lymphs Abs: 1.2 10*3/uL (ref 0.7–4.0)
MCHC: 34.3 g/dL (ref 30.0–36.0)
MCV: 90.7 fl (ref 78.0–100.0)
MONO ABS: 0.5 10*3/uL (ref 0.1–1.0)
MONOS PCT: 10.2 % (ref 3.0–12.0)
NEUTROS PCT: 58.9 % (ref 43.0–77.0)
Neutro Abs: 2.8 10*3/uL (ref 1.4–7.7)
Platelets: 248 10*3/uL (ref 150.0–400.0)
RBC: 5.24 Mil/uL (ref 4.22–5.81)
RDW: 13.4 % (ref 11.5–15.5)
WBC: 4.7 10*3/uL (ref 4.0–10.5)

## 2014-12-26 LAB — LIPID PANEL
CHOL/HDL RATIO: 4
Cholesterol: 196 mg/dL (ref 0–200)
HDL: 48.1 mg/dL (ref >39.00)
LDL Cholesterol: 112 mg/dL — ABNORMAL HIGH (ref 0–99)
NONHDL: 148.13
TRIGLYCERIDES: 181 mg/dL — AB (ref 0.0–149.0)
VLDL: 36.2 mg/dL (ref 0.0–40.0)

## 2014-12-26 LAB — PSA: PSA: 0.57 ng/mL (ref 0.10–4.00)

## 2014-12-26 LAB — TSH: TSH: 2.61 u[IU]/mL (ref 0.35–4.50)

## 2014-12-26 NOTE — Progress Notes (Signed)
Pre visit review using our clinic review tool, if applicable. No additional management support is needed unless otherwise documented below in the visit note. 

## 2014-12-26 NOTE — Patient Instructions (Signed)

## 2014-12-26 NOTE — Progress Notes (Signed)
Subjective:  Patient ID: Lawrence Smith, male    DOB: Jul 18, 1960  Age: 54 y.o. MRN: [id number]  CC: Hypertension   HPI Lawrence Smith presents for follow-up on hypertension and complete physical. He feels well today and offers no complaints.  Outpatient Prescriptions Prior to Visit  Medication Sig Dispense Refill  . aspirin 81 MG chewable tablet Chew 81 mg by mouth daily.    . Multiple Vitamin (MULTIVITAMIN) tablet Take 1 tablet by mouth daily.    . sildenafil (VIAGRA) 100 MG tablet Take 1 tablet (100 mg total) by mouth as needed for erectile dysfunction. 10 tablet 11  . Testosterone (ANDROGEL PUMP) 20.25 MG/ACT (1.62%) GEL Place 2 Act onto the skin daily. 225 g 1  . valACYclovir (VALTREX) 1000 MG tablet TAKE 1 TABLET BY MOUTH TWICE A DAY 20 tablet 3  . valsartan-hydrochlorothiazide (DIOVAN-HCT) 320-12.5 MG per tablet TAKE 1 TABLET BY MOUTH EVERY DAY 90 tablet 1   No facility-administered medications prior to visit.    ROS Review of Systems  All other systems reviewed and are negative.   Objective:  BP 138/86 mmHg  Pulse 65  Temp(Src) 97.8 F (36.6 C) (Oral)  Resp 16  Ht 5\' 10"  (1.778 m)  Wt 201 lb (91.173 kg)  BMI 28.84 kg/m2  SpO2 98%  BP Readings from Last 3 Encounters:  12/26/14 138/86  06/27/14 126/80  12/23/13 120/84    Wt Readings from Last 3 Encounters:  12/26/14 201 lb (91.173 kg)  06/27/14 201 lb (91.173 kg)  12/23/13 198 lb 12.8 oz (90.175 kg)    Physical Exam  Constitutional: He is oriented to person, place, and time. No distress.  HENT:  Mouth/Throat: Oropharynx is clear and moist. No oropharyngeal exudate.  Eyes: Conjunctivae are normal. Left eye exhibits no discharge. No scleral icterus.  Neck: Normal range of motion. Neck supple. No JVD present. No tracheal deviation present. No thyromegaly present.  Cardiovascular: Normal rate, regular rhythm, normal heart sounds and intact distal pulses.  Exam reveals no gallop and no friction rub.   No murmur  heard. Pulmonary/Chest: Effort normal and breath sounds normal. No stridor. No respiratory distress. He has no wheezes. He has no rales. He exhibits no tenderness.  Abdominal: Soft. Bowel sounds are normal. He exhibits no distension and no mass. There is no tenderness. There is no rebound and no guarding. Hernia confirmed negative in the right inguinal area and confirmed negative in the left inguinal area.  Genitourinary: Rectum normal, testes normal and penis normal. Rectal exam shows no external hemorrhoid, no internal hemorrhoid, no fissure, no mass, no tenderness and anal tone normal. Guaiac negative stool. Prostate is enlarged (1+ smooth symm BPH). Prostate is not tender. Right testis shows no mass, no swelling and no tenderness. Right testis is descended. Left testis shows no mass, no swelling and no tenderness. Left testis is descended. Circumcised. No penile erythema or penile tenderness. No discharge found.  Musculoskeletal: Normal range of motion. He exhibits no edema or tenderness.  Lymphadenopathy:    He has no cervical adenopathy.       Right: No inguinal adenopathy present.       Left: No inguinal adenopathy present.  Neurological: He is oriented to person, place, and time.  Skin: Skin is warm and dry. No rash noted. He is not diaphoretic. No erythema. No pallor.  Psychiatric: He has a normal mood and affect. His behavior is normal. Judgment and thought content normal.    Lab Results  Component  Value Date   WBC 4.4* 06/17/2013   HGB 15.7 06/17/2013   HCT 46.0 06/17/2013   PLT 240.0 06/17/2013   GLUCOSE 97 06/17/2013   CHOL 184 06/17/2013   TRIG 58.0 06/17/2013   HDL 43.50 06/17/2013   LDLCALC 129* 06/17/2013   ALT 22 06/17/2013   AST 21 06/17/2013   NA 141 06/17/2013   K 4.0 06/17/2013   CL 105 06/17/2013   CREATININE 1.0 06/17/2013   BUN 23 06/17/2013   CO2 31 06/17/2013   TSH 1.80 06/17/2013   PSA 0.50 06/17/2013   HGBA1C 5.5 06/05/2012    No results  found.  Assessment & Plan:   Lawrence Smith was seen today for hypertension.  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  Essential hypertension- his blood pressure is well-controlled, will monitor his electrolytes and renal function today.  BPH with obstruction/lower urinary tract symptoms- signs and symptoms are well-controlled, will monitor his PSA today to screen for prostate cancer.  Routine general medical examination at a health care facility- exam done, labs ordered, vaccines reviewed, colonoscopy is up-to-date, patient education material was given. Orders: -     Lipid panel; Future -     Comprehensive metabolic panel; Future -     CBC with Differential/Platelet; Future -     TSH; Future -     PSA; Future  I am having Lawrence Smith maintain his aspirin, multivitamin, sildenafil, valACYclovir, Testosterone, and valsartan-hydrochlorothiazide.  No orders of the defined types were placed in this encounter.     Follow-up: Return in about 6 months (around 06/28/2015).  Scarlette Calico, MD

## 2015-01-26 ENCOUNTER — Other Ambulatory Visit: Payer: Self-pay

## 2015-01-26 DIAGNOSIS — E291 Testicular hypofunction: Secondary | ICD-10-CM

## 2015-01-26 MED ORDER — TESTOSTERONE 20.25 MG/ACT (1.62%) TD GEL: 2.0000 | Freq: Every day | TRANSDERMAL | Status: DC

## 2015-01-26 NOTE — Telephone Encounter (Signed)
Lawrence Smith, please contact pt to pick up Rx once Dr Ronnald Ramp signs it, thx

## 2015-01-26 NOTE — Telephone Encounter (Signed)
Noted, thanks,.

## 2015-03-28 ENCOUNTER — Telehealth: Payer: Self-pay | Admitting: *Deleted

## 2015-03-28 DIAGNOSIS — E291 Testicular hypofunction: Secondary | ICD-10-CM

## 2015-03-28 MED ORDER — TESTOSTERONE 20.25 MG/ACT (1.62%) TD GEL: 2.0000 | Freq: Every day | TRANSDERMAL | Status: DC

## 2015-03-28 NOTE — Telephone Encounter (Signed)
Notified pt rx has been fax to [facility]...Lawrence Smith

## 2015-03-28 NOTE — Telephone Encounter (Signed)
Left msg on triage requesting refill on his Andro-Gel needing rx [facility]...Lawrence Smith

## 2015-03-28 NOTE — Telephone Encounter (Signed)
done

## 2015-04-11 ENCOUNTER — Other Ambulatory Visit: Payer: Self-pay

## 2015-04-11 MED ORDER — VALSARTAN-HYDROCHLOROTHIAZIDE 320-12.5 MG PO TABS: 1.0000 | ORAL_TABLET | Freq: Every day | ORAL | Status: DC

## 2015-06-28 ENCOUNTER — Ambulatory Visit (INDEPENDENT_AMBULATORY_CARE_PROVIDER_SITE_OTHER): Payer: BLUE CROSS/BLUE SHIELD | Admitting: Internal Medicine

## 2015-06-28 ENCOUNTER — Encounter: Payer: Self-pay | Admitting: Internal Medicine

## 2015-06-28 ENCOUNTER — Other Ambulatory Visit (INDEPENDENT_AMBULATORY_CARE_PROVIDER_SITE_OTHER): Payer: BLUE CROSS/BLUE SHIELD

## 2015-06-28 VITALS — BP 142/90 | HR 92 | Temp 98.2°F | Resp 16 | Ht 70.0 in | Wt 201.0 lb

## 2015-06-28 DIAGNOSIS — B353 Tinea pedis: Secondary | ICD-10-CM

## 2015-06-28 DIAGNOSIS — I10 Essential (primary) hypertension: Secondary | ICD-10-CM

## 2015-06-28 DIAGNOSIS — B351 Tinea unguium: Secondary | ICD-10-CM | POA: Insufficient documentation

## 2015-06-28 LAB — COMPREHENSIVE METABOLIC PANEL
ALBUMIN: 4.4 g/dL (ref 3.5–5.2)
ALT: 23 U/L (ref 0–53)
AST: 23 U/L (ref 0–37)
Alkaline Phosphatase: 51 U/L (ref 39–117)
BUN: 18 mg/dL (ref 6–23)
CALCIUM: 9.9 mg/dL (ref 8.4–10.5)
CHLORIDE: 101 meq/L (ref 96–112)
CO2: 28 mEq/L (ref 19–32)
Creatinine, Ser: 1.08 mg/dL (ref 0.40–1.50)
GFR: 76.15 mL/min (ref >60.00)
Glucose, Bld: 92 mg/dL (ref 70–99)
POTASSIUM: 3.9 meq/L (ref 3.5–5.1)
SODIUM: 138 meq/L (ref 135–145)
Total Bilirubin: 0.8 mg/dL (ref 0.2–1.2)
Total Protein: 7.6 g/dL (ref 6.0–8.3)

## 2015-06-28 MED ORDER — TERBINAFINE HCL 250 MG PO TABS: 250.0000 mg | ORAL_TABLET | Freq: Every day | ORAL | Status: DC

## 2015-06-28 NOTE — Progress Notes (Signed)
Subjective:  Patient ID: Lawrence Smith, male    DOB: 03-09-1961  Age: 55 y.o. MRN: [id number]  CC: Hypertension   HPI Lawrence Smith presents for a blood pressure check, he complains of an abnormal, uncomfortable toenail on the right foot for several months now. He also has a rash on his left foot. He has tried multiple over-the-counter antifungal's to treat the foot rash and the toenail fungus.  Outpatient Prescriptions Prior to Visit  Medication Sig Dispense Refill  . aspirin 81 MG chewable tablet Chew 81 mg by mouth daily.    . Multiple Vitamin (MULTIVITAMIN) tablet Take 1 tablet by mouth daily.    . sildenafil (VIAGRA) 100 MG tablet Take 1 tablet (100 mg total) by mouth as needed for erectile dysfunction. 10 tablet 11  . Testosterone (ANDROGEL PUMP) 20.25 MG/ACT (1.62%) GEL Place 2 Act onto the skin daily. 225 g 1  . valACYclovir (VALTREX) 1000 MG tablet TAKE 1 TABLET BY MOUTH TWICE A DAY 20 tablet 3  . valsartan-hydrochlorothiazide (DIOVAN-HCT) 320-12.5 MG tablet Take 1 tablet by mouth daily. 90 tablet 1   No facility-administered medications prior to visit.    ROS Review of Systems  Constitutional: Negative for fever, chills, diaphoresis, appetite change and fatigue.  HENT: Negative.   Eyes: Negative.   Respiratory: Negative.  Negative for cough, choking, chest tightness, shortness of breath and stridor.   Cardiovascular: Negative.  Negative for chest pain, palpitations and leg swelling.  Gastrointestinal: Negative.  Negative for nausea, vomiting, abdominal pain, diarrhea and constipation.  Endocrine: Negative.   Genitourinary: Negative.   Musculoskeletal: Negative.  Negative for myalgias, back pain and arthralgias.  Skin: Positive for rash. Negative for color change.  Allergic/Immunologic: Negative.   Neurological: Negative.   Hematological: Negative.  Negative for adenopathy. Does not bruise/bleed easily.  Psychiatric/Behavioral: Negative.     Objective:  BP 142/90  mmHg  Pulse 92  Temp(Src) 98.2 F (36.8 C) (Oral)  Resp 16  Ht 5\' 10"  (1.778 m)  Wt 201 lb (91.173 kg)  BMI 28.84 kg/m2  SpO2 98%  BP Readings from Last 3 Encounters:  06/28/15 142/90  12/26/14 138/86  06/27/14 126/80    Wt Readings from Last 3 Encounters:  06/28/15 201 lb (91.173 kg)  12/26/14 201 lb (91.173 kg)  06/27/14 201 lb (91.173 kg)    Physical Exam  Constitutional: He is oriented to person, place, and time. No distress.  HENT:  Mouth/Throat: Oropharynx is clear and moist. No oropharyngeal exudate.  Eyes: Conjunctivae are normal. Right eye exhibits no discharge. Left eye exhibits no discharge. No scleral icterus.  Neck: Normal range of motion. Neck supple. No JVD present. No tracheal deviation present. No thyromegaly present.  Cardiovascular: Normal rate, regular rhythm, normal heart sounds and intact distal pulses.  Exam reveals no gallop and no friction rub.   No murmur heard. Pulmonary/Chest: Effort normal and breath sounds normal. No stridor. No respiratory distress. He has no wheezes. He has no rales. He exhibits no tenderness.  Abdominal: Soft. Bowel sounds are normal. He exhibits no distension and no mass. There is no tenderness. There is no rebound and no guarding.  Musculoskeletal: Normal range of motion. He exhibits no edema or tenderness.  Lymphadenopathy:    He has no cervical adenopathy.  Neurological: He is oriented to person, place, and time.  Skin: Skin is warm and dry. No rash noted. He is not diaphoretic. No erythema. No pallor.  His right great toenail shows that 75% of  it is affected by subungual debris, lysis, nailbed thickening. There is no erythema, exudate, tenderness.  Over the left foot, the plantar distal aspect there is a serpiginous rash with erythema and scale.  Vitals reviewed.   Lab Results  Component Value Date   WBC 4.7 12/26/2014   HGB 16.3 12/26/2014   HCT 47.5 12/26/2014   PLT 248.0 12/26/2014   GLUCOSE 92 06/28/2015    CHOL 196 12/26/2014   TRIG 181.0* 12/26/2014   HDL 48.10 12/26/2014   LDLCALC 112* 12/26/2014   ALT 23 06/28/2015   AST 23 06/28/2015   NA 138 06/28/2015   K 3.9 06/28/2015   CL 101 06/28/2015   CREATININE 1.08 06/28/2015   BUN 18 06/28/2015   CO2 28 06/28/2015   TSH 2.61 12/26/2014   PSA 0.57 12/26/2014   HGBA1C 5.5 06/05/2012    No results found.  Assessment & Plan:   Lawrence Smith was seen today for hypertension.  Diagnoses and all orders for this visit:  Onychomycosis of right great toe- his liver enzymes are normal, will start terbinafine, I've asked him to return in 4 weeks to recheck his liver enzymes, in the meantime he will notify me if he develops a rash, abdominal pain, nausea, vomiting. -     Comprehensive metabolic panel; Future -     terbinafine (LAMISIL) 250 MG tablet; Take 1 tablet (250 mg total) by mouth daily.  Essential hypertension- his blood pressure is well-controlled, electrolytes and renal function are stable -     Comprehensive metabolic panel; Future  Tinea pedis, left- will treat with terbinafine. -     terbinafine (LAMISIL) 250 MG tablet; Take 1 tablet (250 mg total) by mouth daily.   I am having Lawrence Smith start on terbinafine. I am also having him maintain his aspirin, multivitamin, sildenafil, valACYclovir, Testosterone, and valsartan-hydrochlorothiazide.  Meds ordered this encounter  Medications  . terbinafine (LAMISIL) 250 MG tablet    Sig: Take 1 tablet (250 mg total) by mouth daily.    Dispense:  30 tablet    Refill:  3     Follow-up: Return in about 4 weeks (around 07/26/2015).  Scarlette Calico, MD

## 2015-06-28 NOTE — Progress Notes (Signed)
Pre visit review using our clinic review tool, if applicable. No additional management support is needed unless otherwise documented below in the visit note. 

## 2015-06-28 NOTE — Patient Instructions (Signed)
Nail Ringworm A fungal infection of the nail (tinea unguium/onychomycosis) is common. It is common as the visible part of the nail is composed of dead cells which have no blood supply to help prevent infection. It occurs because fungi are everywhere and will pick any opportunity to grow on any dead material. Because nails are very slow growing they require up to 2 years of treatment with anti-fungal medications. The entire nail back to the base is infected. This includes approximately  of the nail which you cannot see. If your caregiver has prescribed a medication by mouth, take it every day and as directed. No progress will be seen for at least 6 to 9 months. Do not be disappointed! Because fungi live on dead cells with little or no exposure to blood supply, medication delivery to the infection is slow; thus the cure is slow. It is also why you can observe no progress in the first 6 months. The nail becoming cured is the base of the nail, as it has the blood supply. Topical medication such as creams and ointments are usually not effective. Important in successful treatment of nail fungus is closely following the medication regimen that your doctor prescribes. Sometimes you and your caregiver may elect to speed up this process by surgical removal of all the nails. Even this may still require 6 to 9 months of additional oral medications. See your caregiver as directed. Remember there will be no visible improvement for at least 6 months. See your caregiver sooner if other signs of infection (redness and swelling) develop.   This information is not intended to replace advice given to you by your health care provider. Make sure you discuss any questions you have with your health care provider.   Document Released: 05/10/2000 Document Revised: 09/27/2014 Document Reviewed: 11/14/2014 Elsevier Interactive Patient Education 2016 [employer] Inc[employer]  

## 2015-07-12 ENCOUNTER — Encounter: Payer: Self-pay | Admitting: Internal Medicine

## 2015-07-12 ENCOUNTER — Other Ambulatory Visit: Payer: Self-pay | Admitting: Internal Medicine

## 2015-07-18 ENCOUNTER — Encounter: Payer: Self-pay | Admitting: Internal Medicine

## 2015-07-26 ENCOUNTER — Other Ambulatory Visit (INDEPENDENT_AMBULATORY_CARE_PROVIDER_SITE_OTHER): Payer: BLUE CROSS/BLUE SHIELD

## 2015-07-26 ENCOUNTER — Encounter: Payer: Self-pay | Admitting: Internal Medicine

## 2015-07-26 ENCOUNTER — Ambulatory Visit (INDEPENDENT_AMBULATORY_CARE_PROVIDER_SITE_OTHER): Payer: BLUE CROSS/BLUE SHIELD | Admitting: Internal Medicine

## 2015-07-26 VITALS — BP 120/80 | HR 64 | Temp 98.0°F | Resp 16 | Ht 70.0 in | Wt 202.0 lb

## 2015-07-26 DIAGNOSIS — I10 Essential (primary) hypertension: Secondary | ICD-10-CM

## 2015-07-26 DIAGNOSIS — B351 Tinea unguium: Secondary | ICD-10-CM

## 2015-07-26 DIAGNOSIS — Z1159 Encounter for screening for other viral diseases: Secondary | ICD-10-CM

## 2015-07-26 LAB — COMPREHENSIVE METABOLIC PANEL
ALBUMIN: 4.4 g/dL (ref 3.5–5.2)
ALK PHOS: 53 U/L (ref 39–117)
ALT: 19 U/L (ref 0–53)
AST: 19 U/L (ref 0–37)
BUN: 17 mg/dL (ref 6–23)
CALCIUM: 9.7 mg/dL (ref 8.4–10.5)
CHLORIDE: 100 meq/L (ref 96–112)
CO2: 30 mEq/L (ref 19–32)
Creatinine, Ser: 0.95 mg/dL (ref 0.40–1.50)
GFR: 88.27 mL/min (ref >60.00)
Glucose, Bld: 94 mg/dL (ref 70–99)
POTASSIUM: 4.1 meq/L (ref 3.5–5.1)
Sodium: 136 mEq/L (ref 135–145)
TOTAL PROTEIN: 7.6 g/dL (ref 6.0–8.3)
Total Bilirubin: 0.6 mg/dL (ref 0.2–1.2)

## 2015-07-26 LAB — HEPATITIS C ANTIBODY: HCV AB: NEGATIVE

## 2015-07-26 NOTE — Progress Notes (Signed)
Pre visit review using our clinic review tool, if applicable. No additional management support is needed unless otherwise documented below in the visit note. 

## 2015-07-26 NOTE — Patient Instructions (Signed)
Hypertension Hypertension, commonly called high blood pressure, is when the force of blood pumping through your arteries is too strong. Your arteries are the blood vessels that carry blood from your heart throughout your body. A blood pressure reading consists of a higher number over a lower number, such as 110/72. The higher number (systolic) is the pressure inside your arteries when your heart pumps. The lower number (diastolic) is the pressure inside your arteries when your heart relaxes. Ideally you want your blood pressure below 120/80. Hypertension forces your heart to work harder to pump blood. Your arteries may become narrow or stiff. Having untreated or uncontrolled hypertension can cause heart attack, stroke, kidney disease, and other problems. RISK FACTORS Some risk factors for high blood pressure are controllable. Others are not.  Risk factors you cannot control include:   Race. You may be at higher risk if you are African American.  Age. Risk increases with age.  Gender. Men are at higher risk than women before age 45 years. After age 65, women are at higher risk than men. Risk factors you can control include:  Not getting enough exercise or physical activity.  Being overweight.  Getting too much fat, sugar, calories, or salt in your diet.  Drinking too much alcohol. SIGNS AND SYMPTOMS Hypertension does not usually cause signs or symptoms. Extremely high blood pressure (hypertensive crisis) may cause headache, anxiety, shortness of breath, and nosebleed. DIAGNOSIS To check if you have hypertension, your health care provider will measure your blood pressure while you are seated, with your arm held at the level of your heart. It should be measured at least twice using the same arm. Certain conditions can cause a difference in blood pressure between your right and left arms. A blood pressure reading that is higher than normal on one occasion does not mean that you need treatment. If  it is not clear whether you have high blood pressure, you may be asked to return on a different day to have your blood pressure checked again. Or, you may be asked to monitor your blood pressure at home for 1 or more weeks. TREATMENT Treating high blood pressure includes making lifestyle changes and possibly taking medicine. Living a healthy lifestyle can help lower high blood pressure. You may need to change some of your habits. Lifestyle changes may include:  Following the DASH diet. This diet is high in fruits, vegetables, and whole grains. It is low in salt, red meat, and added sugars.  Keep your sodium intake below 2,300 mg per day.  Getting at least 30-45 minutes of aerobic exercise at least 4 times per week.  Losing weight if necessary.  Not smoking.  Limiting alcoholic beverages.  Learning ways to reduce stress. Your health care provider may prescribe medicine if lifestyle changes are not enough to get your blood pressure under control, and if one of the following is true:  You are 18-59 years of age and your systolic blood pressure is above 140.  You are 60 years of age or older, and your systolic blood pressure is above 150.  Your diastolic blood pressure is above 90.  You have diabetes, and your systolic blood pressure is over 140 or your diastolic blood pressure is over 90.  You have kidney disease and your blood pressure is above 140/90.  You have heart disease and your blood pressure is above 140/90. Your personal target blood pressure may vary depending on your medical conditions, your age, and other factors. HOME CARE INSTRUCTIONS    Have your blood pressure rechecked as directed by your health care provider.   Take medicines only as directed by your health care provider. Follow the directions carefully. Blood pressure medicines must be taken as prescribed. The medicine does not work as well when you skip doses. Skipping doses also puts you at risk for  problems.  Do not smoke.   Monitor your blood pressure at home as directed by your health care provider. SEEK MEDICAL CARE IF:   You think you are having a reaction to medicines taken.  You have recurrent headaches or feel dizzy.  You have swelling in your ankles.  You have trouble with your vision. SEEK IMMEDIATE MEDICAL CARE IF:  You develop a severe headache or confusion.  You have unusual weakness, numbness, or feel faint.  You have severe chest or abdominal pain.  You vomit repeatedly.  You have trouble breathing. MAKE SURE YOU:   Understand these instructions.  Will watch your condition.  Will get help right away if you are not doing well or get worse.   This information is not intended to replace advice given to you by your health care provider. Make sure you discuss any questions you have with your health care provider.   Document Released: 05/13/2005 Document Revised: 09/27/2014 Document Reviewed: 03/05/2013 Elsevier Interactive Patient Education 2016 Elsevier Inc.  

## 2015-07-27 ENCOUNTER — Encounter: Payer: Self-pay | Admitting: Internal Medicine

## 2015-07-27 NOTE — Progress Notes (Signed)
Subjective:  Patient ID: Lawrence Smith, male    DOB: 1960-12-27  Age: 55 y.o. MRN: [id number]  CC: Hypertension   HPI MARCELLO KACZKA presents for a blood pressure check and follow-up on onychomycosis and taking terbinafine. He returns today to have his liver enzymes rechecked. He has tolerated the terbinafine well with no abdominal pain, nausea, loss of appetite, or rash.  Outpatient Prescriptions Prior to Visit  Medication Sig Dispense Refill  . aspirin 81 MG chewable tablet Chew 81 mg by mouth daily.    . Multiple Vitamin (MULTIVITAMIN) tablet Take 1 tablet by mouth daily.    Marland Kitchen terbinafine (LAMISIL) 250 MG tablet Take 1 tablet (250 mg total) by mouth daily. 30 tablet 3  . Testosterone (ANDROGEL PUMP) 20.25 MG/ACT (1.62%) GEL Place 2 Act onto the skin daily. 225 g 1  . valACYclovir (VALTREX) 1000 MG tablet TAKE 1 TABLET BY MOUTH TWICE A DAY 20 tablet 3  . valsartan-hydrochlorothiazide (DIOVAN-HCT) 320-12.5 MG tablet Take 1 tablet by mouth daily. 90 tablet 1  . VIAGRA 100 MG tablet TAKE 1 TABLET BY MOUTH DAILY AS NEEDED FOR ERECTILE DYSFUNCTION 10 tablet 1   No facility-administered medications prior to visit.    ROS Review of Systems  Constitutional: Negative.  Negative for chills, appetite change, fatigue and unexpected weight change.  HENT: Negative.  Negative for trouble swallowing.   Eyes: Negative.   Respiratory: Negative.  Negative for cough, choking, chest tightness, shortness of breath and stridor.   Cardiovascular: Negative.  Negative for chest pain, palpitations and leg swelling.  Gastrointestinal: Negative.  Negative for nausea, vomiting, abdominal pain, diarrhea and constipation.  Endocrine: Negative.   Genitourinary: Negative.   Musculoskeletal: Negative.   Skin: Negative.   Allergic/Immunologic: Negative.   Neurological: Negative.   Hematological: Negative.   Psychiatric/Behavioral: Negative.     Objective:  BP 120/80 mmHg  Pulse 64  Temp(Src) 98 F (36.7  C) (Oral)  Resp 16  Ht 5\' 10"  (1.778 m)  Wt 202 lb (91.627 kg)  BMI 28.98 kg/m2  SpO2 96%  BP Readings from Last 3 Encounters:  07/26/15 120/80  06/28/15 142/90  12/26/14 138/86    Wt Readings from Last 3 Encounters:  07/26/15 202 lb (91.627 kg)  06/28/15 201 lb (91.173 kg)  12/26/14 201 lb (91.173 kg)    Physical Exam  Constitutional: He is oriented to person, place, and time. He appears well-developed and well-nourished. No distress.  HENT:  Mouth/Throat: Oropharynx is clear and moist. No oropharyngeal exudate.  Eyes: Conjunctivae are normal. Right eye exhibits no discharge. Left eye exhibits no discharge. No scleral icterus.  Neck: Normal range of motion. Neck supple. No JVD present. No tracheal deviation present. No thyromegaly present.  Cardiovascular: Normal rate, regular rhythm, normal heart sounds and intact distal pulses.  Exam reveals no gallop and no friction rub.   No murmur heard. Pulmonary/Chest: Effort normal and breath sounds normal. No stridor. No respiratory distress. He has no wheezes. He has no rales. He exhibits no tenderness.  Abdominal: Soft. Bowel sounds are normal. He exhibits no distension and no mass. There is no tenderness. There is no rebound and no guarding.  Musculoskeletal: Normal range of motion. He exhibits no edema or tenderness.  Lymphadenopathy:    He has no cervical adenopathy.  Neurological: He is oriented to person, place, and time.  Skin: Skin is warm and dry. No rash noted. He is not diaphoretic. No erythema. No pallor.  Vitals reviewed.   Lab  Results  Component Value Date   WBC 4.7 12/26/2014   HGB 16.3 12/26/2014   HCT 47.5 12/26/2014   PLT 248.0 12/26/2014   GLUCOSE 94 07/26/2015   CHOL 196 12/26/2014   TRIG 181.0* 12/26/2014   HDL 48.10 12/26/2014   LDLCALC 112* 12/26/2014   ALT 19 07/26/2015   AST 19 07/26/2015   NA 136 07/26/2015   K 4.1 07/26/2015   CL 100 07/26/2015   CREATININE 0.95 07/26/2015   BUN 17  07/26/2015   CO2 30 07/26/2015   TSH 2.61 12/26/2014   PSA 0.57 12/26/2014   HGBA1C 5.5 06/05/2012    No results found.  Assessment & Plan:   Flabio was seen today for hypertension.  Diagnoses and all orders for this visit:  Onychomycosis of right great toe- he is doing well on terbinafine with no side effects and normal liver enzymes. Will continue for another 3 months -     Comprehensive metabolic panel; Future  Need for hepatitis C screening test -     Hepatitis C antibody; Future  Essential hypertension- his blood pressure is well-controlled, electrolytes and renal function are stable -     Comprehensive metabolic panel; Future   I am having Mr. Scurto maintain his aspirin, multivitamin, valACYclovir, Testosterone, valsartan-hydrochlorothiazide, terbinafine, and VIAGRA.  No orders of the defined types were placed in this encounter.     Follow-up: Return in about 3 months (around 10/26/2015).  Scarlette Calico, MD

## 2015-09-19 ENCOUNTER — Other Ambulatory Visit: Payer: Self-pay | Admitting: Internal Medicine

## 2015-10-02 ENCOUNTER — Other Ambulatory Visit: Payer: Self-pay | Admitting: Internal Medicine

## 2015-10-02 DIAGNOSIS — E291 Testicular hypofunction: Secondary | ICD-10-CM

## 2015-10-03 ENCOUNTER — Encounter: Payer: Self-pay | Admitting: Internal Medicine

## 2015-10-04 MED ORDER — TESTOSTERONE 20.25 MG/ACT (1.62%) TD GEL
2.0000 | Freq: Every day | TRANSDERMAL | Status: DC
Start: 2015-10-04 — End: 2016-02-19

## 2015-10-04 NOTE — Telephone Encounter (Signed)
Received call pt is requesting refill on his andro Gel need rx to be sent [facility]...Lawrence Smith

## 2015-10-04 NOTE — Telephone Encounter (Signed)
Duplicate msg see previous email med was approved and fax to [facility]...[name]

## 2015-10-04 NOTE — Telephone Encounter (Signed)
OK to fill this prescription with additional refills x1     Thank you!    ----- Message -----     From: [name]     Sent: 10/04/2015 10:49 AM      To: Cassandria Anger, MD    Subject: Non-Urgent Medical Question                 ----- Message from Earnstine Regal, MA sent at 10/04/2015 10:49 AM EDT -----        MD ok refill printed 7 called pt inform has been faxed to [facility]...[name]

## 2015-10-04 NOTE — Addendum Note (Signed)
Addended by: Earnstine Regal on: 10/04/2015 01:13 PM   Modules accepted: Orders

## 2015-10-22 ENCOUNTER — Other Ambulatory Visit: Payer: Self-pay | Admitting: Internal Medicine

## 2015-10-26 ENCOUNTER — Ambulatory Visit (INDEPENDENT_AMBULATORY_CARE_PROVIDER_SITE_OTHER): Payer: BLUE CROSS/BLUE SHIELD | Admitting: Internal Medicine

## 2015-10-26 ENCOUNTER — Encounter: Payer: Self-pay | Admitting: Internal Medicine

## 2015-10-26 VITALS — BP 128/86 | HR 81 | Temp 97.7°F | Resp 16 | Ht 70.0 in | Wt 196.0 lb

## 2015-10-26 DIAGNOSIS — B351 Tinea unguium: Secondary | ICD-10-CM

## 2015-10-26 DIAGNOSIS — I10 Essential (primary) hypertension: Secondary | ICD-10-CM

## 2015-10-26 NOTE — Patient Instructions (Signed)
Hypertension Hypertension, commonly called high blood pressure, is when the force of blood pumping through your arteries is too strong. Your arteries are the blood vessels that carry blood from your heart throughout your body. A blood pressure reading consists of a higher number over a lower number, such as 110/72. The higher number (systolic) is the pressure inside your arteries when your heart pumps. The lower number (diastolic) is the pressure inside your arteries when your heart relaxes. Ideally you want your blood pressure below 120/80. Hypertension forces your heart to work harder to pump blood. Your arteries may become narrow or stiff. Having untreated or uncontrolled hypertension can cause heart attack, stroke, kidney disease, and other problems. RISK FACTORS Some risk factors for high blood pressure are controllable. Others are not.  Risk factors you cannot control include:   Race. You may be at higher risk if you are African American.  Age. Risk increases with age.  Gender. Men are at higher risk than women before age 45 years. After age 65, women are at higher risk than men. Risk factors you can control include:  Not getting enough exercise or physical activity.  Being overweight.  Getting too much fat, sugar, calories, or salt in your diet.  Drinking too much alcohol. SIGNS AND SYMPTOMS Hypertension does not usually cause signs or symptoms. Extremely high blood pressure (hypertensive crisis) may cause headache, anxiety, shortness of breath, and nosebleed. DIAGNOSIS To check if you have hypertension, your health care provider will measure your blood pressure while you are seated, with your arm held at the level of your heart. It should be measured at least twice using the same arm. Certain conditions can cause a difference in blood pressure between your right and left arms. A blood pressure reading that is higher than normal on one occasion does not mean that you need treatment. If  it is not clear whether you have high blood pressure, you may be asked to return on a different day to have your blood pressure checked again. Or, you may be asked to monitor your blood pressure at home for 1 or more weeks. TREATMENT Treating high blood pressure includes making lifestyle changes and possibly taking medicine. Living a healthy lifestyle can help lower high blood pressure. You may need to change some of your habits. Lifestyle changes may include:  Following the DASH diet. This diet is high in fruits, vegetables, and whole grains. It is low in salt, red meat, and added sugars.  Keep your sodium intake below 2,300 mg per day.  Getting at least 30-45 minutes of aerobic exercise at least 4 times per week.  Losing weight if necessary.  Not smoking.  Limiting alcoholic beverages.  Learning ways to reduce stress. Your health care provider may prescribe medicine if lifestyle changes are not enough to get your blood pressure under control, and if one of the following is true:  You are 18-59 years of age and your systolic blood pressure is above 140.  You are 60 years of age or older, and your systolic blood pressure is above 150.  Your diastolic blood pressure is above 90.  You have diabetes, and your systolic blood pressure is over 140 or your diastolic blood pressure is over 90.  You have kidney disease and your blood pressure is above 140/90.  You have heart disease and your blood pressure is above 140/90. Your personal target blood pressure may vary depending on your medical conditions, your age, and other factors. HOME CARE INSTRUCTIONS    Have your blood pressure rechecked as directed by your health care provider.   Take medicines only as directed by your health care provider. Follow the directions carefully. Blood pressure medicines must be taken as prescribed. The medicine does not work as well when you skip doses. Skipping doses also puts you at risk for  problems.  Do not smoke.   Monitor your blood pressure at home as directed by your health care provider. SEEK MEDICAL CARE IF:   You think you are having a reaction to medicines taken.  You have recurrent headaches or feel dizzy.  You have swelling in your ankles.  You have trouble with your vision. SEEK IMMEDIATE MEDICAL CARE IF:  You develop a severe headache or confusion.  You have unusual weakness, numbness, or feel faint.  You have severe chest or abdominal pain.  You vomit repeatedly.  You have trouble breathing. MAKE SURE YOU:   Understand these instructions.  Will watch your condition.  Will get help right away if you are not doing well or get worse.   This information is not intended to replace advice given to you by your health care provider. Make sure you discuss any questions you have with your health care provider.   Document Released: 05/13/2005 Document Revised: 09/27/2014 Document Reviewed: 03/05/2013 Elsevier Interactive Patient Education 2016 Elsevier Inc.  

## 2015-10-26 NOTE — Progress Notes (Signed)
Pre visit review using our clinic review tool, if applicable. No additional management support is needed unless otherwise documented below in the visit note. 

## 2015-10-28 ENCOUNTER — Other Ambulatory Visit: Payer: Self-pay | Admitting: Internal Medicine

## 2015-10-29 NOTE — Progress Notes (Signed)
Subjective:  Patient ID: Lawrence Smith, male    DOB: [birth year]  Age: 55 y.o. MRN: [id number]  CC: Hypertension   HPI TESFA OBRIANT presents for a blood pressure check and follow-up on toenail onychomycosis. He has been taking terbinafine for about 3 months and is doing well on it with no side effects such as rash, abdominal pain, nausea, or loss of appetite. He also tells me his blood pressure has been well controlled on the combination of valsartan hydrochlorothiazide. He denies headache/blurred vision/chest pain/shortness of breath/edema/fatigue.  Outpatient Prescriptions Prior to Visit  Medication Sig Dispense Refill  . aspirin 81 MG chewable tablet Chew 81 mg by mouth daily.    Marland Kitchen CIALIS 20 MG tablet TAKE 1 TABLET DAILY AS NEEDED 10 tablet 3  . Multiple Vitamin (MULTIVITAMIN) tablet Take 1 tablet by mouth daily.    Marland Kitchen terbinafine (LAMISIL) 250 MG tablet Take 1 tablet (250 mg total) by mouth daily. 30 tablet 3  . Testosterone (ANDROGEL PUMP) 20.25 MG/ACT (1.62%) GEL Place 2 Act onto the skin daily. 225 g 1  . valACYclovir (VALTREX) 1000 MG tablet TAKE 1 TABLET BY MOUTH TWICE A DAY 20 tablet 3  . valsartan-hydrochlorothiazide (DIOVAN-HCT) 320-12.5 MG tablet Take 1 tablet by mouth daily. Yearly physical due in August 90 tablet 0  . VIAGRA 100 MG tablet TAKE 1 TABLET BY MOUTH DAILY AS NEEDED FOR ERECTILE DYSFUNCTION 10 tablet 1   No facility-administered medications prior to visit.    ROS Review of Systems  Constitutional: Negative.  Negative for activity change, appetite change and fatigue.  HENT: Negative.   Eyes: Negative.  Negative for visual disturbance.  Respiratory: Negative.  Negative for cough, choking, chest tightness, shortness of breath and stridor.   Cardiovascular: Negative.  Negative for chest pain and leg swelling.  Gastrointestinal: Negative.  Negative for nausea, vomiting, abdominal pain, diarrhea and constipation.  Endocrine: Negative.   Genitourinary: Negative.   Negative for difficulty urinating.  Musculoskeletal: Negative.  Negative for myalgias, back pain and neck pain.  Skin: Negative.  Negative for color change and rash.  Allergic/Immunologic: Negative.   Neurological: Negative.  Negative for dizziness, tremors, weakness, light-headedness, numbness and headaches.  Hematological: Negative.  Negative for adenopathy. Does not bruise/bleed easily.  Psychiatric/Behavioral: Negative.   All other systems reviewed and are negative.   Objective:  BP 128/86 mmHg  Pulse 81  Temp(Src) 97.7 F (36.5 C) (Oral)  Resp 16  Ht 5\' 10"  (1.778 m)  Wt 196 lb (88.905 kg)  BMI 28.12 kg/m2  SpO2 96%  BP Readings from Last 3 Encounters:  10/26/15 128/86  07/26/15 120/80  06/28/15 142/90    Wt Readings from Last 3 Encounters:  10/26/15 196 lb (88.905 kg)  07/26/15 202 lb (91.627 kg)  06/28/15 201 lb (91.173 kg)    Physical Exam  Constitutional: He is oriented to person, place, and time. No distress.  HENT:  Mouth/Throat: Oropharynx is clear and moist. No oropharyngeal exudate.  Eyes: Conjunctivae are normal. Right eye exhibits no discharge. Left eye exhibits no discharge. No scleral icterus.  Neck: Normal range of motion. Neck supple. No JVD present. No tracheal deviation present. No thyromegaly present.  Cardiovascular: Normal rate, regular rhythm, normal heart sounds and intact distal pulses.  Exam reveals no gallop and no friction rub.   No murmur heard. Pulmonary/Chest: Effort normal and breath sounds normal. No stridor. No respiratory distress. He has no wheezes. He has no rales. He exhibits no tenderness.  Abdominal: Soft.  Bowel sounds are normal. He exhibits no distension and no mass. There is no tenderness. There is no rebound and no guarding.  Musculoskeletal: Normal range of motion. He exhibits no edema or tenderness.  Lymphadenopathy:    He has no cervical adenopathy.  Neurological: He is oriented to person, place, and time.  Skin: Skin is  warm and dry. No rash noted. He is not diaphoretic. No erythema. No pallor.  Vitals reviewed.   Lab Results  Component Value Date   WBC 4.7 12/26/2014   HGB 16.3 12/26/2014   HCT 47.5 12/26/2014   PLT 248.0 12/26/2014   GLUCOSE 94 07/26/2015   CHOL 196 12/26/2014   TRIG 181.0* 12/26/2014   HDL 48.10 12/26/2014   LDLCALC 112* 12/26/2014   ALT 19 07/26/2015   AST 19 07/26/2015   NA 136 07/26/2015   K 4.1 07/26/2015   CL 100 07/26/2015   CREATININE 0.95 07/26/2015   BUN 17 07/26/2015   CO2 30 07/26/2015   TSH 2.61 12/26/2014   PSA 0.57 12/26/2014   HGBA1C 5.5 06/05/2012    No results found.  Assessment & Plan:   Roberth was seen today for hypertension.  Diagnoses and all orders for this visit:  Essential hypertension- his blood pressure is adequately well-controlled  Onychomycosis of right great toe- improvement noted, normal appearing toenails are growing, he is tolerating this well, we'll continue to complete a four-month therapy.  I am having Mr. Danza maintain his aspirin, multivitamin, terbinafine, VIAGRA, CIALIS, valACYclovir, Testosterone, and valsartan-hydrochlorothiazide.  No orders of the defined types were placed in this encounter.     Follow-up: Return in about 4 months (around 02/25/2016).  Scarlette Calico, MD

## 2016-02-18 ENCOUNTER — Other Ambulatory Visit: Payer: Self-pay | Admitting: Internal Medicine

## 2016-02-19 ENCOUNTER — Encounter: Payer: Self-pay | Admitting: Internal Medicine

## 2016-02-19 ENCOUNTER — Other Ambulatory Visit: Payer: Self-pay | Admitting: Internal Medicine

## 2016-02-19 DIAGNOSIS — E291 Testicular hypofunction: Secondary | ICD-10-CM

## 2016-02-19 MED ORDER — TESTOSTERONE 20.25 MG/ACT (1.62%) TD GEL: 2.0000 | Freq: Every day | TRANSDERMAL | 1 refills | Status: DC

## 2016-03-26 ENCOUNTER — Other Ambulatory Visit: Payer: Self-pay | Admitting: Internal Medicine

## 2016-03-26 ENCOUNTER — Encounter: Payer: Self-pay | Admitting: Internal Medicine

## 2016-03-26 DIAGNOSIS — I10 Essential (primary) hypertension: Secondary | ICD-10-CM

## 2016-03-26 MED ORDER — VALSARTAN-HYDROCHLOROTHIAZIDE 320-12.5 MG PO TABS: 1.0000 | ORAL_TABLET | Freq: Every day | ORAL | 1 refills | Status: DC

## 2016-03-26 MED ORDER — VALSARTAN-HYDROCHLOROTHIAZIDE 320-12.5 MG PO TABS: 1.0000 | ORAL_TABLET | Freq: Every day | ORAL | 3 refills | Status: DC

## 2016-04-22 ENCOUNTER — Other Ambulatory Visit: Payer: Self-pay | Admitting: Internal Medicine

## 2016-04-22 ENCOUNTER — Encounter: Payer: Self-pay | Admitting: Internal Medicine

## 2016-04-22 DIAGNOSIS — E291 Testicular hypofunction: Secondary | ICD-10-CM

## 2016-04-22 MED ORDER — TESTOSTERONE 20.25 MG/ACT (1.62%) TD GEL: 2.0000 | Freq: Every day | TRANSDERMAL | 1 refills | Status: DC

## 2016-04-23 NOTE — Telephone Encounter (Signed)
rx faxed to pharmacy

## 2016-04-29 ENCOUNTER — Ambulatory Visit (INDEPENDENT_AMBULATORY_CARE_PROVIDER_SITE_OTHER): Payer: BLUE CROSS/BLUE SHIELD | Admitting: Internal Medicine

## 2016-04-29 ENCOUNTER — Other Ambulatory Visit (INDEPENDENT_AMBULATORY_CARE_PROVIDER_SITE_OTHER): Payer: BLUE CROSS/BLUE SHIELD

## 2016-04-29 ENCOUNTER — Encounter: Payer: Self-pay | Admitting: Internal Medicine

## 2016-04-29 VITALS — BP 138/92 | HR 72 | Temp 98.1°F | Resp 16 | Ht 70.0 in | Wt 202.0 lb

## 2016-04-29 DIAGNOSIS — I10 Essential (primary) hypertension: Secondary | ICD-10-CM

## 2016-04-29 DIAGNOSIS — Z0000 Encounter for general adult medical examination without abnormal findings: Secondary | ICD-10-CM

## 2016-04-29 DIAGNOSIS — Z23 Encounter for immunization: Secondary | ICD-10-CM

## 2016-04-29 LAB — PSA: PSA: 0.5 ng/mL (ref 0.10–4.00)

## 2016-04-29 LAB — COMPREHENSIVE METABOLIC PANEL
ALT: 27 U/L (ref 0–53)
AST: 21 U/L (ref 0–37)
Albumin: 4.4 g/dL (ref 3.5–5.2)
Alkaline Phosphatase: 51 U/L (ref 39–117)
BILIRUBIN TOTAL: 0.8 mg/dL (ref 0.2–1.2)
BUN: 23 mg/dL (ref 6–23)
CHLORIDE: 102 meq/L (ref 96–112)
CO2: 29 meq/L (ref 19–32)
CREATININE: 1.1 mg/dL (ref 0.40–1.50)
Calcium: 9.9 mg/dL (ref 8.4–10.5)
GFR: 74.31 mL/min (ref >60.00)
GLUCOSE: 89 mg/dL (ref 70–99)
Potassium: 4 mEq/L (ref 3.5–5.1)
SODIUM: 139 meq/L (ref 135–145)
Total Protein: 7.2 g/dL (ref 6.0–8.3)

## 2016-04-29 LAB — CBC WITH DIFFERENTIAL/PLATELET
BASOS ABS: 0 10*3/uL (ref 0.0–0.1)
BASOS PCT: 0.4 % (ref 0.0–3.0)
EOS ABS: 0.2 10*3/uL (ref 0.0–0.7)
Eosinophils Relative: 2.1 % (ref 0.0–5.0)
HEMATOCRIT: 46.9 % (ref 39.0–52.0)
Hemoglobin: 16.1 g/dL (ref 13.0–17.0)
LYMPHS ABS: 1.5 10*3/uL (ref 0.7–4.0)
Lymphocytes Relative: 19.6 % (ref 12.0–46.0)
MCHC: 34.4 g/dL (ref 30.0–36.0)
MCV: 89 fl (ref 78.0–100.0)
Monocytes Absolute: 0.6 10*3/uL (ref 0.1–1.0)
Monocytes Relative: 8.1 % (ref 3.0–12.0)
NEUTROS ABS: 5.2 10*3/uL (ref 1.4–7.7)
NEUTROS PCT: 69.8 % (ref 43.0–77.0)
PLATELETS: 268 10*3/uL (ref 150.0–400.0)
RBC: 5.26 Mil/uL (ref 4.22–5.81)
RDW: 13.7 % (ref 11.5–15.5)
WBC: 7.4 10*3/uL (ref 4.0–10.5)

## 2016-04-29 LAB — LIPID PANEL
CHOL/HDL RATIO: 4
Cholesterol: 227 mg/dL — ABNORMAL HIGH (ref 0–200)
HDL: 61.9 mg/dL (ref >39.00)
LDL CALC: 144 mg/dL — AB (ref 0–99)
NONHDL: 165.22
TRIGLYCERIDES: 104 mg/dL (ref 0.0–149.0)
VLDL: 20.8 mg/dL (ref 0.0–40.0)

## 2016-04-29 LAB — URINALYSIS, ROUTINE W REFLEX MICROSCOPIC
BILIRUBIN URINE: NEGATIVE
Hgb urine dipstick: NEGATIVE
KETONES UR: NEGATIVE
Leukocytes, UA: NEGATIVE
Nitrite: NEGATIVE
PH: 6 (ref 5.0–8.0)
Specific Gravity, Urine: >=1.03 — AB (ref 1.000–1.030)
UROBILINOGEN UA: 1 (ref 0.0–1.0)
Urine Glucose: NEGATIVE

## 2016-04-29 LAB — TSH: TSH: 1.27 u[IU]/mL (ref 0.35–4.50)

## 2016-04-29 NOTE — Patient Instructions (Signed)

## 2016-04-29 NOTE — Progress Notes (Signed)
Pre visit review using our clinic review tool, if applicable. No additional management support is needed unless otherwise documented below in the visit note. 

## 2016-04-29 NOTE — Progress Notes (Signed)
Subjective:  Patient ID: Lawrence Smith, male    DOB: February 27, 1961  Age: 55 y.o. MRN: [id number]  CC: Annual Exam and Hypertension   HPI Lawrence A Arrowsmith presents for a CPX and HTN f/up. He feels well and offers no c/o's. His BP has been well controlled at home. He denies DOE,CP, SOB, fatigue, edema, or palpitations.   Outpatient Medications Prior to Visit  Medication Sig Dispense Refill  . aspirin 81 MG chewable tablet Chew 81 mg by mouth daily.    Marland Kitchen CIALIS 20 MG tablet TAKE 1 TABLET DAILY AS NEEDED 10 tablet 3  . Testosterone (ANDROGEL PUMP) 20.25 MG/ACT (1.62%) GEL Place 2 Act onto the skin daily. 225 g 1  . valACYclovir (VALTREX) 1000 MG tablet TAKE 1 TABLET BY MOUTH TWICE A DAY 20 tablet 3  . valsartan-hydrochlorothiazide (DIOVAN-HCT) 320-12.5 MG tablet Take 1 tablet by mouth daily. Yearly physical due in August 90 tablet 1  . VIAGRA 100 MG tablet TAKE 1 TABLET BY MOUTH DAILY AS NEEDED FOR ERECTILE DYSFUNCTION 10 tablet 1  . Multiple Vitamin (MULTIVITAMIN) tablet Take 1 tablet by mouth daily.    Marland Kitchen terbinafine (LAMISIL) 250 MG tablet TAKE 1 TABLET (250 MG TOTAL) BY MOUTH DAILY. 30 tablet 3   No facility-administered medications prior to visit.     ROS Review of Systems  Constitutional: Negative.  Negative for activity change, appetite change, diaphoresis and fatigue.  HENT: Negative.   Eyes: Negative for visual disturbance.  Respiratory: Negative for cough, chest tightness, shortness of breath and wheezing.   Cardiovascular: Negative for chest pain, palpitations and leg swelling.  Gastrointestinal: Negative for abdominal pain, constipation, diarrhea, nausea and vomiting.  Endocrine: Negative.  Negative for cold intolerance and heat intolerance.  Genitourinary: Negative.  Negative for difficulty urinating, discharge, dysuria, frequency, penile pain, penile swelling, scrotal swelling, testicular pain and urgency.  Musculoskeletal: Negative.  Negative for arthralgias, back pain,  myalgias and neck pain.  Skin: Negative.   Allergic/Immunologic: Negative.   Neurological: Negative.  Negative for dizziness, weakness and headaches.  Hematological: Negative.  Negative for adenopathy. Does not bruise/bleed easily.  Psychiatric/Behavioral: Negative.  Negative for decreased concentration, dysphoric mood and sleep disturbance. The patient is not nervous/anxious.     Objective:  BP (!) 138/92 (BP Location: Left Arm, Patient Position: Sitting, Cuff Size: Large)   Pulse 72   Temp 98.1 F (36.7 C) (Oral)   Resp 16   Ht 5\' 10"  (1.778 m)   Wt 202 lb (91.6 kg)   SpO2 99%   BMI 28.98 kg/m   BP Readings from Last 3 Encounters:  04/29/16 (!) 138/92  10/26/15 128/86  07/26/15 120/80    Wt Readings from Last 3 Encounters:  04/29/16 202 lb (91.6 kg)  10/26/15 196 lb (88.9 kg)  07/26/15 202 lb (91.6 kg)    Physical Exam  Constitutional: He is oriented to person, place, and time. No distress.  HENT:  Mouth/Throat: Oropharynx is clear and moist. No oropharyngeal exudate.  Eyes: Conjunctivae are normal. Right eye exhibits no discharge. Left eye exhibits no discharge. No scleral icterus.  Neck: Normal range of motion. Neck supple. No JVD present. No tracheal deviation present. No thyromegaly present.  Cardiovascular: Normal rate, regular rhythm, normal heart sounds and intact distal pulses.  Exam reveals no gallop and no friction rub.   No murmur heard. Pulmonary/Chest: Effort normal and breath sounds normal. No stridor. No respiratory distress. He has no wheezes. He has no rales. He exhibits no tenderness.  Abdominal: Soft. Bowel sounds are normal. He exhibits no distension and no mass. There is no tenderness. There is no rebound and no guarding. Hernia confirmed negative in the right inguinal area and confirmed negative in the left inguinal area.  Genitourinary: Rectum normal, prostate normal, testes normal and penis normal. Rectal exam shows no external hemorrhoid, no  internal hemorrhoid, no fissure, no mass, no tenderness, anal tone normal and guaiac negative stool. Prostate is not enlarged and not tender. Right testis shows no mass, no swelling and no tenderness. Right testis is descended. Left testis shows no mass, no swelling and no tenderness. Left testis is descended. Circumcised. No penile erythema or penile tenderness. No discharge found.  Musculoskeletal: Normal range of motion. He exhibits no edema, tenderness or deformity.  Lymphadenopathy:    He has no cervical adenopathy.       Right: No inguinal adenopathy present.       Left: No inguinal adenopathy present.  Neurological: He is oriented to person, place, and time.  Skin: Skin is warm and dry. No rash noted. He is not diaphoretic. No erythema. No pallor.  Psychiatric: He has a normal mood and affect. His behavior is normal. Judgment and thought content normal.  Vitals reviewed.   Lab Results  Component Value Date   WBC 7.4 04/29/2016   HGB 16.1 04/29/2016   HCT 46.9 04/29/2016   PLT 268.0 04/29/2016   GLUCOSE 89 04/29/2016   CHOL 227 (H) 04/29/2016   TRIG 104.0 04/29/2016   HDL 61.90 04/29/2016   LDLCALC 144 (H) 04/29/2016   ALT 27 04/29/2016   AST 21 04/29/2016   NA 139 04/29/2016   K 4.0 04/29/2016   CL 102 04/29/2016   CREATININE 1.10 04/29/2016   BUN 23 04/29/2016   CO2 29 04/29/2016   TSH 1.27 04/29/2016   PSA 0.50 04/29/2016   HGBA1C 5.5 06/05/2012    No results found.  Assessment & Plan:   Johne was seen today for annual exam and hypertension.  Diagnoses and all orders for this visit:  Need for prophylactic vaccination and inoculation against influenza -     Flu Vaccine QUAD 36+ mos IM  Routine general medical examination at a health care facility- exam completed, labs ordered and reviewed, his Framingham risk score is only 6% so I do not recommend that he take a statin, his colonoscopy is up-to-date, patient education material was given. -     Lipid panel;  Future -     Comprehensive metabolic panel; Future -     CBC with Differential/Platelet; Future -     PSA; Future -     Urinalysis, Routine w reflex microscopic (not at [facility]); Future -     TSH; Future  Essential hypertension- his blood pressure is well controlled, electrolytes and renal function are normal. Will continue the ARB/thiazide diuretic combination.   I have discontinued Mr. Wiemann's multivitamin and terbinafine. I am also having him maintain his aspirin, VIAGRA, CIALIS, valACYclovir, valsartan-hydrochlorothiazide, and Testosterone.  No orders of the defined types were placed in this encounter.    Follow-up: Return in about 6 months (around 10/28/2016).  Scarlette Calico, MD

## 2016-04-30 ENCOUNTER — Encounter: Payer: Self-pay | Admitting: Internal Medicine

## 2016-05-22 NOTE — Telephone Encounter (Signed)
Error

## 2016-09-05 ENCOUNTER — Encounter: Payer: Self-pay | Admitting: Internal Medicine

## 2016-09-05 DIAGNOSIS — E291 Testicular hypofunction: Secondary | ICD-10-CM

## 2016-09-10 MED ORDER — TESTOSTERONE 20.25 MG/ACT (1.62%) TD GEL: 2.0000 | Freq: Every day | TRANSDERMAL | 0 refills | Status: DC

## 2016-10-28 ENCOUNTER — Encounter: Payer: Self-pay | Admitting: Internal Medicine

## 2016-10-28 ENCOUNTER — Other Ambulatory Visit (INDEPENDENT_AMBULATORY_CARE_PROVIDER_SITE_OTHER): Payer: BLUE CROSS/BLUE SHIELD

## 2016-10-28 ENCOUNTER — Ambulatory Visit (INDEPENDENT_AMBULATORY_CARE_PROVIDER_SITE_OTHER): Payer: BLUE CROSS/BLUE SHIELD | Admitting: Internal Medicine

## 2016-10-28 VITALS — BP 136/80 | HR 81 | Temp 97.5°F | Resp 16 | Ht 70.0 in | Wt 195.5 lb

## 2016-10-28 DIAGNOSIS — N522 Drug-induced erectile dysfunction: Secondary | ICD-10-CM | POA: Diagnosis not present

## 2016-10-28 DIAGNOSIS — B001 Herpesviral vesicular dermatitis: Secondary | ICD-10-CM

## 2016-10-28 DIAGNOSIS — I10 Essential (primary) hypertension: Secondary | ICD-10-CM | POA: Diagnosis not present

## 2016-10-28 DIAGNOSIS — E291 Testicular hypofunction: Secondary | ICD-10-CM

## 2016-10-28 LAB — CBC WITH DIFFERENTIAL/PLATELET
BASOS PCT: 0.5 % (ref 0.0–3.0)
Basophils Absolute: 0 10*3/uL (ref 0.0–0.1)
EOS ABS: 0.2 10*3/uL (ref 0.0–0.7)
EOS PCT: 2.9 % (ref 0.0–5.0)
HEMATOCRIT: 48.9 % (ref 39.0–52.0)
HEMOGLOBIN: 16.6 g/dL (ref 13.0–17.0)
LYMPHS PCT: 20 % (ref 12.0–46.0)
Lymphs Abs: 1.4 10*3/uL (ref 0.7–4.0)
MCHC: 33.9 g/dL (ref 30.0–36.0)
MCV: 89.7 fl (ref 78.0–100.0)
MONO ABS: 0.6 10*3/uL (ref 0.1–1.0)
Monocytes Relative: 8.8 % (ref 3.0–12.0)
Neutro Abs: 4.7 10*3/uL (ref 1.4–7.7)
Neutrophils Relative %: 67.8 % (ref 43.0–77.0)
Platelets: 226 10*3/uL (ref 150.0–400.0)
RBC: 5.45 Mil/uL (ref 4.22–5.81)
RDW: 13.8 % (ref 11.5–15.5)
WBC: 6.9 10*3/uL (ref 4.0–10.5)

## 2016-10-28 LAB — COMPREHENSIVE METABOLIC PANEL
ALK PHOS: 46 U/L (ref 39–117)
ALT: 18 U/L (ref 0–53)
AST: 18 U/L (ref 0–37)
Albumin: 4.3 g/dL (ref 3.5–5.2)
BILIRUBIN TOTAL: 0.8 mg/dL (ref 0.2–1.2)
BUN: 18 mg/dL (ref 6–23)
CALCIUM: 9.8 mg/dL (ref 8.4–10.5)
CO2: 30 mEq/L (ref 19–32)
CREATININE: 0.91 mg/dL (ref 0.40–1.50)
Chloride: 101 mEq/L (ref 96–112)
GFR: 92.32 mL/min (ref >60.00)
Glucose, Bld: 94 mg/dL (ref 70–99)
Potassium: 3.7 mEq/L (ref 3.5–5.1)
SODIUM: 138 meq/L (ref 135–145)
TOTAL PROTEIN: 7.2 g/dL (ref 6.0–8.3)

## 2016-10-28 MED ORDER — TESTOSTERONE 20.25 MG/ACT (1.62%) TD GEL
2.0000 | Freq: Every day | TRANSDERMAL | 1 refills | Status: DC
Start: 2016-10-28 — End: 2016-10-30

## 2016-10-28 MED ORDER — VALSARTAN-HYDROCHLOROTHIAZIDE 320-12.5 MG PO TABS: 1.0000 | ORAL_TABLET | Freq: Every day | ORAL | 1 refills | Status: DC

## 2016-10-28 MED ORDER — VALACYCLOVIR HCL 1 G PO TABS: 1000.0000 mg | ORAL_TABLET | Freq: Two times a day (BID) | ORAL | 3 refills | Status: DC

## 2016-10-28 MED ORDER — TADALAFIL 20 MG PO TABS: 20.0000 mg | ORAL_TABLET | Freq: Every day | ORAL | 11 refills | Status: DC | PRN

## 2016-10-28 NOTE — Patient Instructions (Signed)

## 2016-10-28 NOTE — Progress Notes (Signed)
Subjective:  Patient ID: Lawrence Smith, male    DOB: 1960-06-03  Age: 56 y.o. MRN: [id number]  CC: Hypertension   HPI Lawrence Smith presents for f/up - He feels well and offers no complaints. He occasionally has outbreaks of herpes but says it's well-controlled with Valtrex.  Outpatient Medications Prior to Visit  Medication Sig Dispense Refill  . aspirin 81 MG chewable tablet Chew 81 mg by mouth daily.    Marland Kitchen CIALIS 20 MG tablet TAKE 1 TABLET DAILY AS NEEDED 10 tablet 3  . Testosterone (ANDROGEL PUMP) 20.25 MG/ACT (1.62%) GEL Place 2 Act onto the skin daily. 225 g 0  . valACYclovir (VALTREX) 1000 MG tablet TAKE 1 TABLET BY MOUTH TWICE A DAY 20 tablet 3  . valsartan-hydrochlorothiazide (DIOVAN-HCT) 320-12.5 MG tablet Take 1 tablet by mouth daily. Yearly physical due in August 90 tablet 1  . VIAGRA 100 MG tablet TAKE 1 TABLET BY MOUTH DAILY AS NEEDED FOR ERECTILE DYSFUNCTION 10 tablet 1   No facility-administered medications prior to visit.     ROS Review of Systems  Constitutional: Negative.  Negative for diaphoresis and fatigue.  HENT: Negative.   Eyes: Negative.  Negative for visual disturbance.  Respiratory: Negative.  Negative for cough, chest tightness, shortness of breath and wheezing.   Cardiovascular: Negative.  Negative for chest pain, palpitations and leg swelling.  Gastrointestinal: Negative.  Negative for abdominal pain, constipation, diarrhea, nausea and vomiting.  Endocrine: Negative.   Genitourinary: Negative.  Negative for difficulty urinating.  Musculoskeletal: Negative.  Negative for arthralgias and myalgias.  Skin: Negative.   Allergic/Immunologic: Negative.   Neurological: Negative.   Hematological: Negative for adenopathy. Does not bruise/bleed easily.  Psychiatric/Behavioral: Negative.     Objective:  BP 136/80 (BP Location: Left Arm, Patient Position: Sitting, Cuff Size: Large)   Pulse 81   Temp 97.5 F (36.4 C) (Oral)   Resp 16   Ht 5\' 10"  (1.778  m)   Wt 195 lb 8 oz (88.7 kg)   SpO2 98%   BMI 28.05 kg/m   BP Readings from Last 3 Encounters:  10/28/16 136/80  04/29/16 (!) 138/92  10/26/15 128/86    Wt Readings from Last 3 Encounters:  10/28/16 195 lb 8 oz (88.7 kg)  04/29/16 202 lb (91.6 kg)  10/26/15 196 lb (88.9 kg)    Physical Exam  Constitutional: He is oriented to person, place, and time. No distress.  HENT:  Mouth/Throat: Oropharynx is clear and moist. No oropharyngeal exudate.  Eyes: Conjunctivae are normal. Right eye exhibits no discharge. Left eye exhibits no discharge. No scleral icterus.  Neck: Normal range of motion. Neck supple. No JVD present. No thyromegaly present.  Cardiovascular: Normal rate and regular rhythm.   No murmur heard. Pulmonary/Chest: Effort normal and breath sounds normal. No respiratory distress. He has no wheezes. He has no rales. He exhibits no tenderness.  Abdominal: Soft. Normal appearance and bowel sounds are normal. He exhibits no distension and no mass. There is no hepatosplenomegaly. There is no tenderness.  Musculoskeletal: Normal range of motion. He exhibits no edema, tenderness or deformity.  Lymphadenopathy:    He has no cervical adenopathy.  Neurological: He is oriented to person, place, and time.  Skin: Skin is warm and dry. No rash noted. He is not diaphoretic. No erythema. No pallor.  Vitals reviewed.   Lab Results  Component Value Date   WBC 6.9 10/28/2016   HGB 16.6 10/28/2016   HCT 48.9 10/28/2016   PLT  226.0 10/28/2016   GLUCOSE 94 10/28/2016   CHOL 227 (H) 04/29/2016   TRIG 104.0 04/29/2016   HDL 61.90 04/29/2016   LDLCALC 144 (H) 04/29/2016   ALT 18 10/28/2016   AST 18 10/28/2016   NA 138 10/28/2016   K 3.7 10/28/2016   CL 101 10/28/2016   CREATININE 0.91 10/28/2016   BUN 18 10/28/2016   CO2 30 10/28/2016   TSH 1.27 04/29/2016   PSA 0.50 04/29/2016   HGBA1C 5.5 06/05/2012    No results found.  Assessment & Plan:   Lawrence Smith was seen today for  hypertension.  Diagnoses and all orders for this visit:  Hypogonadism in male- his labs are negative for any toxicity and he is tolerating current dose well, we'll continue -     CBC with Differential/Platelet; Future -     Comprehensive metabolic panel; Future  Essential hypertension- his blood pressure is well-controlled, electrolytes and renal function normal. -     Comprehensive metabolic panel; Future -     valsartan-hydrochlorothiazide (DIOVAN-HCT) 320-12.5 MG tablet; Take 1 tablet by mouth daily. Yearly physical due in August  Herpes labialis -     valACYclovir (VALTREX) 1000 MG tablet; Take 1 tablet (1,000 mg total) by mouth 2 (two) times daily.  Drug-induced erectile dysfunction -     tadalafil (CIALIS) 20 MG tablet; Take 1 tablet (20 mg total) by mouth daily as needed.  Hypogonadism male -     Testosterone (ANDROGEL PUMP) 20.25 MG/ACT (1.62%) GEL; Place 2 Act onto the skin daily.   I have discontinued Lawrence Smith's VIAGRA. I have changed his CIALIS to tadalafil. I have also changed his valACYclovir. Additionally, I am having him maintain his aspirin, Testosterone, and valsartan-hydrochlorothiazide.  Meds ordered this encounter  Medications  . tadalafil (CIALIS) 20 MG tablet    Sig: Take 1 tablet (20 mg total) by mouth daily as needed.    Dispense:  8 tablet    Refill:  11  . Testosterone (ANDROGEL PUMP) 20.25 MG/ACT (1.62%) GEL    Sig: Place 2 Act onto the skin daily.    Dispense:  225 g    Refill:  1  . valsartan-hydrochlorothiazide (DIOVAN-HCT) 320-12.5 MG tablet    Sig: Take 1 tablet by mouth daily. Yearly physical due in August    Dispense:  90 tablet    Refill:  1  . valACYclovir (VALTREX) 1000 MG tablet    Sig: Take 1 tablet (1,000 mg total) by mouth 2 (two) times daily.    Dispense:  20 tablet    Refill:  3     Follow-up: Return in about 6 months (around 04/29/2017).  Scarlette Calico, MD

## 2016-10-29 ENCOUNTER — Encounter: Payer: Self-pay | Admitting: Internal Medicine

## 2016-10-29 ENCOUNTER — Other Ambulatory Visit: Payer: Self-pay | Admitting: Internal Medicine

## 2016-10-29 DIAGNOSIS — I10 Essential (primary) hypertension: Secondary | ICD-10-CM

## 2016-10-29 DIAGNOSIS — E291 Testicular hypofunction: Secondary | ICD-10-CM

## 2016-10-30 ENCOUNTER — Other Ambulatory Visit: Payer: Self-pay | Admitting: Internal Medicine

## 2016-10-30 DIAGNOSIS — E291 Testicular hypofunction: Secondary | ICD-10-CM

## 2016-10-31 NOTE — Telephone Encounter (Signed)
rx faxed to mail order 

## 2016-11-06 ENCOUNTER — Telehealth: Payer: Self-pay

## 2016-11-06 NOTE — Telephone Encounter (Signed)
Key: [id number]

## 2016-11-07 NOTE — Telephone Encounter (Signed)
[name] from [facility] Department called needing more information on for this PA. They are wanting to know what other medications have been tried or failed. A message can be left on her voicemail [phone]).

## 2016-11-08 ENCOUNTER — Telehealth: Payer: Self-pay | Admitting: Internal Medicine

## 2016-11-08 NOTE — Telephone Encounter (Signed)
[name] called again about this . Please advise

## 2016-11-11 NOTE — Telephone Encounter (Signed)
Spoke to [facility] with [employer] and she stated that the allow per 30 days is 4 tablets.

## 2016-11-11 NOTE — Telephone Encounter (Signed)
LVM for pt to call back as soon as possible.   RE: Please get me when [facility] calls back.

## 2016-11-21 NOTE — Telephone Encounter (Signed)
Patient informed. 

## 2017-01-29 ENCOUNTER — Other Ambulatory Visit: Payer: Self-pay | Admitting: Internal Medicine

## 2017-01-29 DIAGNOSIS — B001 Herpesviral vesicular dermatitis: Secondary | ICD-10-CM

## 2017-03-26 ENCOUNTER — Encounter: Payer: Self-pay | Admitting: Internal Medicine

## 2017-03-27 ENCOUNTER — Other Ambulatory Visit: Payer: Self-pay | Admitting: Internal Medicine

## 2017-03-27 DIAGNOSIS — E291 Testicular hypofunction: Secondary | ICD-10-CM

## 2017-03-27 MED ORDER — TESTOSTERONE 20.25 MG/ACT (1.62%) TD GEL: TRANSDERMAL | 3 refills | Status: DC

## 2017-04-03 ENCOUNTER — Other Ambulatory Visit: Payer: Self-pay | Admitting: Internal Medicine

## 2017-04-03 DIAGNOSIS — E291 Testicular hypofunction: Secondary | ICD-10-CM

## 2017-04-03 NOTE — Telephone Encounter (Signed)
Sent to patients mail order.

## 2017-04-21 ENCOUNTER — Other Ambulatory Visit: Payer: Self-pay | Admitting: Internal Medicine

## 2017-04-21 DIAGNOSIS — E291 Testicular hypofunction: Secondary | ICD-10-CM

## 2017-04-22 DIAGNOSIS — H40033 Anatomical narrow angle, bilateral: Secondary | ICD-10-CM | POA: Diagnosis not present

## 2017-04-22 DIAGNOSIS — H04123 Dry eye syndrome of bilateral lacrimal glands: Secondary | ICD-10-CM | POA: Diagnosis not present

## 2017-04-29 ENCOUNTER — Ambulatory Visit: Payer: BLUE CROSS/BLUE SHIELD | Admitting: Internal Medicine

## 2017-04-29 ENCOUNTER — Encounter: Payer: Self-pay | Admitting: Internal Medicine

## 2017-04-29 ENCOUNTER — Other Ambulatory Visit (INDEPENDENT_AMBULATORY_CARE_PROVIDER_SITE_OTHER): Payer: BLUE CROSS/BLUE SHIELD

## 2017-04-29 VITALS — BP 144/90 | HR 78 | Temp 98.3°F | Ht 70.0 in | Wt 200.0 lb

## 2017-04-29 DIAGNOSIS — N401 Enlarged prostate with lower urinary tract symptoms: Secondary | ICD-10-CM | POA: Diagnosis not present

## 2017-04-29 DIAGNOSIS — I10 Essential (primary) hypertension: Secondary | ICD-10-CM

## 2017-04-29 DIAGNOSIS — Z23 Encounter for immunization: Secondary | ICD-10-CM | POA: Diagnosis not present

## 2017-04-29 DIAGNOSIS — E291 Testicular hypofunction: Secondary | ICD-10-CM

## 2017-04-29 DIAGNOSIS — N138 Other obstructive and reflux uropathy: Secondary | ICD-10-CM | POA: Diagnosis not present

## 2017-04-29 LAB — COMPREHENSIVE METABOLIC PANEL
ALBUMIN: 4.7 g/dL (ref 3.5–5.2)
ALT: 26 U/L (ref 0–53)
AST: 21 U/L (ref 0–37)
Alkaline Phosphatase: 50 U/L (ref 39–117)
BUN: 22 mg/dL (ref 6–23)
CALCIUM: 9.8 mg/dL (ref 8.4–10.5)
CHLORIDE: 100 meq/L (ref 96–112)
CO2: 30 meq/L (ref 19–32)
CREATININE: 0.99 mg/dL (ref 0.40–1.50)
GFR: 83.61 mL/min (ref >60.00)
Glucose, Bld: 100 mg/dL — ABNORMAL HIGH (ref 70–99)
Potassium: 4.4 mEq/L (ref 3.5–5.1)
Sodium: 137 mEq/L (ref 135–145)
Total Bilirubin: 0.8 mg/dL (ref 0.2–1.2)
Total Protein: 7.6 g/dL (ref 6.0–8.3)

## 2017-04-29 LAB — CBC WITH DIFFERENTIAL/PLATELET
BASOS PCT: 0.7 % (ref 0.0–3.0)
Basophils Absolute: 0.1 10*3/uL (ref 0.0–0.1)
Eosinophils Absolute: 0.2 10*3/uL (ref 0.0–0.7)
Eosinophils Relative: 2.6 % (ref 0.0–5.0)
HEMATOCRIT: 49.8 % (ref 39.0–52.0)
HEMOGLOBIN: 17.2 g/dL — AB (ref 13.0–17.0)
LYMPHS ABS: 1.7 10*3/uL (ref 0.7–4.0)
Lymphocytes Relative: 23.6 % (ref 12.0–46.0)
MCHC: 34.5 g/dL (ref 30.0–36.0)
MCV: 90.4 fl (ref 78.0–100.0)
MONO ABS: 0.7 10*3/uL (ref 0.1–1.0)
Monocytes Relative: 9.4 % (ref 3.0–12.0)
NEUTROS ABS: 4.7 10*3/uL (ref 1.4–7.7)
Neutrophils Relative %: 63.7 % (ref 43.0–77.0)
Platelets: 260 10*3/uL (ref 150.0–400.0)
RBC: 5.5 Mil/uL (ref 4.22–5.81)
RDW: 13.4 % (ref 11.5–15.5)
WBC: 7.4 10*3/uL (ref 4.0–10.5)

## 2017-04-29 LAB — PSA: PSA: 0.62 ng/mL (ref 0.10–4.00)

## 2017-04-29 MED ORDER — TESTOSTERONE 20.25 MG/ACT (1.62%) TD GEL: TRANSDERMAL | 1 refills | Status: DC

## 2017-04-29 NOTE — Progress Notes (Signed)
Subjective:  Patient ID: Lawrence Smith, male    DOB: April 13, 1961  Age: 56 y.o. MRN: [id number]  CC: Hypertension   HPI Lawrence Smith presents for a BP check - his blood pressure has been well controlled.  He feels well today and offers no complaints.  Outpatient Medications Prior to Visit  Medication Sig Dispense Refill  . aspirin 81 MG chewable tablet Chew 81 mg by mouth daily.    . tadalafil (CIALIS) 20 MG tablet Take 1 tablet (20 mg total) by mouth daily as needed. 8 tablet 11  . valACYclovir (VALTREX) 1000 MG tablet TAKE 1 TABLET BY MOUTH TWICE A DAY 20 tablet 1  . valsartan-hydrochlorothiazide (DIOVAN-HCT) 320-12.5 MG tablet Take 1 tablet by mouth daily. Yearly physical due in August 90 tablet 1  . Testosterone 20.25 MG/ACT (1.62%) GEL APPLY 2 PUMPS ONTO THE SKIN DAILY 75 g 0   No facility-administered medications prior to visit.     ROS Review of Systems  Constitutional: Negative.  Negative for diaphoresis, fatigue and unexpected weight change.  HENT: Negative.   Eyes: Negative for visual disturbance.  Respiratory: Negative.  Negative for cough, chest tightness, shortness of breath and wheezing.   Cardiovascular: Negative for chest pain, palpitations and leg swelling.  Gastrointestinal: Negative.  Negative for abdominal pain, diarrhea, nausea and vomiting.  Endocrine: Negative.   Genitourinary: Negative.  Negative for difficulty urinating and dysuria.  Musculoskeletal: Negative.  Negative for back pain, myalgias and neck pain.  Skin: Negative.   Allergic/Immunologic: Negative.   Neurological: Negative.   Hematological: Negative for adenopathy. Does not bruise/bleed easily.  Psychiatric/Behavioral: Negative.     Objective:  BP (!) 144/90 (BP Location: Left Arm, Patient Position: Sitting, Cuff Size: Normal)   Pulse 78   Temp 98.3 F (36.8 C) (Oral)   Ht 5\' 10"  (1.778 m)   Wt 200 lb (90.7 kg)   SpO2 98%   BMI 28.70 kg/m   BP Readings from Last 3 Encounters:    04/29/17 (!) 144/90  10/28/16 136/80  04/29/16 (!) 138/92    Wt Readings from Last 3 Encounters:  04/29/17 200 lb (90.7 kg)  10/28/16 195 lb 8 oz (88.7 kg)  04/29/16 202 lb (91.6 kg)    Physical Exam  Constitutional: He is oriented to person, place, and time. No distress.  HENT:  Mouth/Throat: Oropharynx is clear and moist. No oropharyngeal exudate.  Eyes: Conjunctivae are normal. Right eye exhibits no discharge. Left eye exhibits no discharge. No scleral icterus.  Neck: Normal range of motion. Neck supple. No JVD present. No thyromegaly present.  Cardiovascular: Normal rate, regular rhythm and normal heart sounds. Exam reveals no gallop.  No murmur heard. Pulmonary/Chest: Effort normal and breath sounds normal. He has no wheezes. He has no rales.  Abdominal: Soft. Bowel sounds are normal. He exhibits no mass. There is no tenderness. There is no guarding.  Musculoskeletal: Normal range of motion. He exhibits no edema, tenderness or deformity.  Neurological: He is alert and oriented to person, place, and time.  Skin: Skin is warm and dry. No rash noted. He is not diaphoretic. No erythema. No pallor.  Vitals reviewed.   Lab Results  Component Value Date   WBC 7.4 04/29/2017   HGB 17.2 (H) 04/29/2017   HCT 49.8 04/29/2017   PLT 260.0 04/29/2017   GLUCOSE 100 (H) 04/29/2017   CHOL 227 (H) 04/29/2016   TRIG 104.0 04/29/2016   HDL 61.90 04/29/2016   LDLCALC 144 (H) 04/29/2016  ALT 26 04/29/2017   AST 21 04/29/2017   NA 137 04/29/2017   K 4.4 04/29/2017   CL 100 04/29/2017   CREATININE 0.99 04/29/2017   BUN 22 04/29/2017   CO2 30 04/29/2017   TSH 1.27 04/29/2016   PSA 0.62 04/29/2017   HGBA1C 5.5 06/05/2012    No results found.  Assessment & Plan:   Natanael was seen today for hypertension.  Diagnoses and all orders for this visit:  Need for influenza vaccination -     Flu Vaccine QUAD 36+ mos IM  Essential hypertension- His blood pressure is well controlled.   Electrolytes and renal function are normal. -     Comprehensive metabolic panel; Future  Hypogonadism in male- He has a very slight increase in his hemoglobin at 17.2 but this is not high enough to warrant a decrease in his testosterone dose.  He is doing well at the current dose. Will continue. -     Testosterone 20.25 MG/ACT (1.62%) GEL; APPLY 2 PUMPS ONTO THE SKIN DAILY -     CBC with Differential/Platelet; Future  Hypogonadism male -     Testosterone 20.25 MG/ACT (1.62%) GEL; APPLY 2 PUMPS ONTO THE SKIN DAILY  BPH with obstruction/lower urinary tract symptoms- his PSA remains low at 0.62 so I am not concerned about prostate cancer.  He has no symptoms that need to be treated. -     PSA; Future   I am having Welden A. Soulliere maintain his aspirin, tadalafil, valsartan-hydrochlorothiazide, valACYclovir, and Testosterone.  Meds ordered this encounter  Medications  . Testosterone 20.25 MG/ACT (1.62%) GEL    Sig: APPLY 2 PUMPS ONTO THE SKIN DAILY    Dispense:  225 g    Refill:  1     Follow-up: Return in about 6 months (around 10/28/2017).  Scarlette Calico, MD

## 2017-04-29 NOTE — Telephone Encounter (Signed)
error 

## 2017-04-29 NOTE — Patient Instructions (Signed)

## 2017-04-30 ENCOUNTER — Encounter: Payer: Self-pay | Admitting: Internal Medicine

## 2017-07-10 ENCOUNTER — Encounter: Payer: Self-pay | Admitting: Internal Medicine

## 2017-07-10 DIAGNOSIS — E291 Testicular hypofunction: Secondary | ICD-10-CM

## 2017-07-11 NOTE — Telephone Encounter (Signed)
Spoke to pt and informed that labs are needed per insurance to cover the testosterone gel. Pt stated understanding and is coming in next week to have labs drawn.  Order placed for both labs and comment added that labs must be drawn before 10am.

## 2017-07-14 ENCOUNTER — Other Ambulatory Visit: Payer: BLUE CROSS/BLUE SHIELD

## 2017-07-14 ENCOUNTER — Other Ambulatory Visit: Payer: Self-pay | Admitting: Internal Medicine

## 2017-07-14 DIAGNOSIS — E291 Testicular hypofunction: Secondary | ICD-10-CM | POA: Diagnosis not present

## 2017-07-14 DIAGNOSIS — I10 Essential (primary) hypertension: Secondary | ICD-10-CM

## 2017-07-15 ENCOUNTER — Other Ambulatory Visit: Payer: BLUE CROSS/BLUE SHIELD

## 2017-07-15 ENCOUNTER — Encounter: Payer: Self-pay | Admitting: Internal Medicine

## 2017-07-15 DIAGNOSIS — E291 Testicular hypofunction: Secondary | ICD-10-CM

## 2017-07-15 LAB — TESTOSTERONE TOTAL,FREE,BIO, MALES
Albumin: 4.1 g/dL (ref 3.6–5.1)
SEX HORMONE BINDING: 32 nmol/L (ref 10–50)
TESTOSTERONE: 237 ng/dL — AB (ref 250–827)

## 2017-07-16 LAB — TESTOSTERONE TOTAL,FREE,BIO, MALES
ALBUMIN MSPROF: 4.2 g/dL (ref 3.6–5.1)
Sex Hormone Binding: 29 nmol/L (ref 10–50)
TESTOSTERONE: 758 ng/dL (ref 250–827)
Testosterone, Bioavailable: 245.4 ng/dL (ref >=110.0)
Testosterone, Free: 127.4 pg/mL (ref 46.0–224.0)

## 2017-07-23 NOTE — Telephone Encounter (Signed)
Pt would like a refill of testosterone.   PA was approved.

## 2017-07-25 ENCOUNTER — Other Ambulatory Visit: Payer: Self-pay | Admitting: Internal Medicine

## 2017-07-25 DIAGNOSIS — E291 Testicular hypofunction: Secondary | ICD-10-CM

## 2017-07-25 MED ORDER — TESTOSTERONE 20.25 MG/ACT (1.62%) TD GEL: TRANSDERMAL | 1 refills | Status: DC

## 2017-08-26 ENCOUNTER — Encounter: Payer: Self-pay | Admitting: Internal Medicine

## 2017-09-30 ENCOUNTER — Other Ambulatory Visit: Payer: Self-pay | Admitting: Internal Medicine

## 2017-09-30 DIAGNOSIS — I10 Essential (primary) hypertension: Secondary | ICD-10-CM

## 2017-11-03 ENCOUNTER — Ambulatory Visit: Payer: BLUE CROSS/BLUE SHIELD | Admitting: Internal Medicine

## 2017-11-05 ENCOUNTER — Ambulatory Visit: Payer: BLUE CROSS/BLUE SHIELD | Admitting: Internal Medicine

## 2017-11-05 ENCOUNTER — Other Ambulatory Visit (INDEPENDENT_AMBULATORY_CARE_PROVIDER_SITE_OTHER): Payer: BLUE CROSS/BLUE SHIELD

## 2017-11-05 ENCOUNTER — Encounter: Payer: Self-pay | Admitting: Internal Medicine

## 2017-11-05 VITALS — BP 132/86 | HR 67 | Temp 98.9°F | Resp 16 | Ht 70.0 in | Wt 202.2 lb

## 2017-11-05 DIAGNOSIS — I10 Essential (primary) hypertension: Secondary | ICD-10-CM

## 2017-11-05 DIAGNOSIS — E785 Hyperlipidemia, unspecified: Secondary | ICD-10-CM | POA: Insufficient documentation

## 2017-11-05 DIAGNOSIS — D229 Melanocytic nevi, unspecified: Secondary | ICD-10-CM | POA: Diagnosis not present

## 2017-11-05 DIAGNOSIS — E291 Testicular hypofunction: Secondary | ICD-10-CM

## 2017-11-05 LAB — CBC WITH DIFFERENTIAL/PLATELET
BASOS PCT: 0.5 % (ref 0.0–3.0)
Basophils Absolute: 0 10*3/uL (ref 0.0–0.1)
EOS ABS: 0.1 10*3/uL (ref 0.0–0.7)
Eosinophils Relative: 2.1 % (ref 0.0–5.0)
HCT: 43.9 % (ref 39.0–52.0)
HEMOGLOBIN: 15 g/dL (ref 13.0–17.0)
Lymphocytes Relative: 17.6 % (ref 12.0–46.0)
Lymphs Abs: 1.2 10*3/uL (ref 0.7–4.0)
MCHC: 34.1 g/dL (ref 30.0–36.0)
MCV: 90.4 fl (ref 78.0–100.0)
Monocytes Absolute: 0.6 10*3/uL (ref 0.1–1.0)
Monocytes Relative: 8.3 % (ref 3.0–12.0)
NEUTROS PCT: 71.5 % (ref 43.0–77.0)
Neutro Abs: 4.8 10*3/uL (ref 1.4–7.7)
PLATELETS: 231 10*3/uL (ref 150.0–400.0)
RBC: 4.85 Mil/uL (ref 4.22–5.81)
RDW: 13.9 % (ref 11.5–15.5)
WBC: 6.7 10*3/uL (ref 4.0–10.5)

## 2017-11-05 LAB — COMPREHENSIVE METABOLIC PANEL
ALT: 26 U/L (ref 0–53)
AST: 24 U/L (ref 0–37)
Albumin: 4.2 g/dL (ref 3.5–5.2)
Alkaline Phosphatase: 53 U/L (ref 39–117)
BUN: 15 mg/dL (ref 6–23)
CALCIUM: 9.6 mg/dL (ref 8.4–10.5)
CHLORIDE: 102 meq/L (ref 96–112)
CO2: 28 meq/L (ref 19–32)
CREATININE: 0.94 mg/dL (ref 0.40–1.50)
GFR: 88.59 mL/min (ref >60.00)
GLUCOSE: 91 mg/dL (ref 70–99)
Potassium: 3.8 mEq/L (ref 3.5–5.1)
Sodium: 137 mEq/L (ref 135–145)
Total Bilirubin: 0.6 mg/dL (ref 0.2–1.2)
Total Protein: 7.1 g/dL (ref 6.0–8.3)

## 2017-11-05 LAB — LIPID PANEL
CHOL/HDL RATIO: 4
Cholesterol: 210 mg/dL — ABNORMAL HIGH (ref 0–200)
HDL: 53.9 mg/dL (ref >39.00)
LDL CALC: 137 mg/dL — AB (ref 0–99)
NONHDL: 156.4
TRIGLYCERIDES: 95 mg/dL (ref 0.0–149.0)
VLDL: 19 mg/dL (ref 0.0–40.0)

## 2017-11-05 NOTE — Patient Instructions (Signed)

## 2017-11-05 NOTE — Progress Notes (Signed)
Subjective:  Patient ID: Lawrence Smith, male    DOB: November 23, 1960  Age: 57 y.o. MRN: [id number]  CC: Hypertension and Hyperlipidemia   HPI Lawrence Smith presents for f/up -he tells me his blood pressure has been well controlled.  He is very active, he did a 20 mile bike ride in the last week and did not experience any CP, DOE, S OB, palpitations, edema, or fatigue.  About a month ago his wife noticed an atypical mole on his back.  It does not bother him.  Outpatient Medications Prior to Visit  Medication Sig Dispense Refill  . aspirin 81 MG chewable tablet Chew 81 mg by mouth daily.    . tadalafil (CIALIS) 20 MG tablet Take 1 tablet (20 mg total) by mouth daily as needed. 8 tablet 11  . Testosterone 20.25 MG/ACT (1.62%) GEL APPLY 2 PUMPS ONTO THE SKIN DAILY 225 g 1  . valACYclovir (VALTREX) 1000 MG tablet TAKE 1 TABLET BY MOUTH TWICE A DAY 20 tablet 1  . valsartan-hydrochlorothiazide (DIOVAN-HCT) 320-12.5 MG tablet TAKE 1 TABLET BY MOUTH DAILY. YEARLY PHYSICAL DUE IN AUGUST 90 tablet 0   No facility-administered medications prior to visit.     ROS Review of Systems  Constitutional: Negative.  Negative for chills, diaphoresis and fatigue.  HENT: Negative.   Eyes: Negative for visual disturbance.  Respiratory: Negative for apnea, cough, chest tightness, shortness of breath and wheezing.   Cardiovascular: Negative for chest pain, palpitations and leg swelling.  Gastrointestinal: Negative for abdominal pain, constipation, diarrhea, nausea and vomiting.  Endocrine: Negative.   Genitourinary: Negative.  Negative for difficulty urinating.  Musculoskeletal: Negative.  Negative for arthralgias and myalgias.  Skin: Negative.   Neurological: Negative.  Negative for dizziness, weakness and light-headedness.  Hematological: Negative for adenopathy. Does not bruise/bleed easily.  Psychiatric/Behavioral: Negative.     Objective:  BP 132/86 (BP Location: Left Arm, Patient Position: Sitting,  Cuff Size: Normal)   Pulse 67   Temp 98.9 F (37.2 C) (Oral)   Resp 16   Ht 5\' 10"  (1.778 m)   Wt 202 lb 4 oz (91.7 kg)   SpO2 97%   BMI 29.02 kg/m   BP Readings from Last 3 Encounters:  11/05/17 132/86  04/29/17 (!) 144/90  10/28/16 136/80    Wt Readings from Last 3 Encounters:  11/05/17 202 lb 4 oz (91.7 kg)  04/29/17 200 lb (90.7 kg)  10/28/16 195 lb 8 oz (88.7 kg)    Physical Exam  Constitutional: He is oriented to person, place, and time. No distress.  HENT:  Mouth/Throat: Oropharynx is clear and moist. No oropharyngeal exudate.  Eyes: Conjunctivae are normal. No scleral icterus.  Neck: Normal range of motion. Neck supple. No JVD present. No thyromegaly present.  Cardiovascular: Normal rate, regular rhythm and normal heart sounds. Exam reveals no gallop.  No murmur heard. Pulmonary/Chest: Effort normal and breath sounds normal. He has no wheezes. He has no rales.  Abdominal: Soft. Bowel sounds are normal. He exhibits no mass. There is no hepatosplenomegaly. There is no tenderness.  Musculoskeletal: Normal range of motion. He exhibits no edema, tenderness or deformity.       Arms: Lymphadenopathy:    He has no cervical adenopathy.  Neurological: He is alert and oriented to person, place, and time.  Skin: Skin is warm and dry. No rash noted. He is not diaphoretic. No erythema.  Vitals reviewed.   Lab Results  Component Value Date   WBC 6.7 11/05/2017  HGB 15.0 11/05/2017   HCT 43.9 11/05/2017   PLT 231.0 11/05/2017   GLUCOSE 91 11/05/2017   CHOL 210 (H) 11/05/2017   TRIG 95.0 11/05/2017   HDL 53.90 11/05/2017   LDLCALC 137 (H) 11/05/2017   ALT 26 11/05/2017   AST 24 11/05/2017   NA 137 11/05/2017   K 3.8 11/05/2017   CL 102 11/05/2017   CREATININE 0.94 11/05/2017   BUN 15 11/05/2017   CO2 28 11/05/2017   TSH 1.27 04/29/2016   PSA 0.62 04/29/2017   HGBA1C 5.5 06/05/2012    No results found.  Assessment & Plan:   Jakaree was seen today for  hypertension and hyperlipidemia.  Diagnoses and all orders for this visit:  Essential hypertension - His blood pressure is adequately well controlled.  Electrolytes and renal function are normal. -     Comprehensive metabolic panel; Future -     CBC with Differential/Platelet; Future  Hyperlipidemia LDL goal <130- He does not have an elevated ASCVD risk score so I do not recommend a statin for CV risk reduction. -     Lipid panel; Future  Hypogonadism in male- He is doing well on the current dose of testosterone replacement therapy.  Lab work is negative for complications.  Atypical mole -     Ambulatory referral to Dermatology   I am having Lawrence Smith maintain his aspirin, tadalafil, valACYclovir, Testosterone, and valsartan-hydrochlorothiazide.  No orders of the defined types were placed in this encounter.    Follow-up: Return in about 6 months (around 05/07/2018).  Scarlette Calico, MD

## 2017-11-06 ENCOUNTER — Encounter: Payer: Self-pay | Admitting: Internal Medicine

## 2017-12-15 DIAGNOSIS — C4359 Malignant melanoma of other part of trunk: Secondary | ICD-10-CM | POA: Diagnosis not present

## 2017-12-15 DIAGNOSIS — C439 Malignant melanoma of skin, unspecified: Secondary | ICD-10-CM

## 2017-12-15 DIAGNOSIS — L919 Hypertrophic disorder of the skin, unspecified: Secondary | ICD-10-CM | POA: Diagnosis not present

## 2017-12-15 DIAGNOSIS — D485 Neoplasm of uncertain behavior of skin: Secondary | ICD-10-CM | POA: Diagnosis not present

## 2017-12-15 DIAGNOSIS — D229 Melanocytic nevi, unspecified: Secondary | ICD-10-CM | POA: Diagnosis not present

## 2017-12-15 HISTORY — DX: Malignant melanoma of skin, unspecified: C43.9

## 2017-12-17 ENCOUNTER — Other Ambulatory Visit: Payer: Self-pay | Admitting: Internal Medicine

## 2017-12-17 DIAGNOSIS — I10 Essential (primary) hypertension: Secondary | ICD-10-CM

## 2017-12-22 ENCOUNTER — Other Ambulatory Visit: Payer: Self-pay | Admitting: Internal Medicine

## 2017-12-22 DIAGNOSIS — B001 Herpesviral vesicular dermatitis: Secondary | ICD-10-CM

## 2018-01-19 DIAGNOSIS — C4359 Malignant melanoma of other part of trunk: Secondary | ICD-10-CM | POA: Diagnosis not present

## 2018-01-21 ENCOUNTER — Other Ambulatory Visit: Payer: Self-pay | Admitting: Internal Medicine

## 2018-01-21 ENCOUNTER — Telehealth: Payer: Self-pay

## 2018-01-21 DIAGNOSIS — E291 Testicular hypofunction: Secondary | ICD-10-CM

## 2018-01-21 MED ORDER — TESTOSTERONE 20.25 MG/ACT (1.62%) TD GEL: TRANSDERMAL | 1 refills | Status: DC

## 2018-01-21 NOTE — Telephone Encounter (Signed)
Rx rq for testosterone 1.62% gel to [facility]. Please advise if refill is approved.

## 2018-03-04 ENCOUNTER — Other Ambulatory Visit: Payer: Self-pay | Admitting: Internal Medicine

## 2018-03-04 DIAGNOSIS — I10 Essential (primary) hypertension: Secondary | ICD-10-CM

## 2018-03-04 MED ORDER — VALSARTAN-HYDROCHLOROTHIAZIDE 320-12.5 MG PO TABS: 1.0000 | ORAL_TABLET | Freq: Every day | ORAL | 0 refills | Status: DC

## 2018-05-08 DIAGNOSIS — C44619 Basal cell carcinoma of skin of left upper limb, including shoulder: Secondary | ICD-10-CM | POA: Diagnosis not present

## 2018-05-08 DIAGNOSIS — D235 Other benign neoplasm of skin of trunk: Secondary | ICD-10-CM | POA: Diagnosis not present

## 2018-05-08 DIAGNOSIS — L570 Actinic keratosis: Secondary | ICD-10-CM | POA: Diagnosis not present

## 2018-05-08 DIAGNOSIS — D229 Melanocytic nevi, unspecified: Secondary | ICD-10-CM

## 2018-05-08 DIAGNOSIS — C4491 Basal cell carcinoma of skin, unspecified: Secondary | ICD-10-CM

## 2018-05-08 HISTORY — DX: Melanocytic nevi, unspecified: D22.9

## 2018-05-08 HISTORY — DX: Basal cell carcinoma of skin, unspecified: C44.91

## 2018-05-11 ENCOUNTER — Other Ambulatory Visit (INDEPENDENT_AMBULATORY_CARE_PROVIDER_SITE_OTHER): Payer: BLUE CROSS/BLUE SHIELD

## 2018-05-11 ENCOUNTER — Encounter: Payer: Self-pay | Admitting: Internal Medicine

## 2018-05-11 ENCOUNTER — Ambulatory Visit: Payer: BLUE CROSS/BLUE SHIELD | Admitting: Internal Medicine

## 2018-05-11 VITALS — BP 138/96 | HR 90 | Temp 98.4°F | Resp 16 | Ht 70.0 in | Wt 201.0 lb

## 2018-05-11 DIAGNOSIS — Z0000 Encounter for general adult medical examination without abnormal findings: Secondary | ICD-10-CM

## 2018-05-11 DIAGNOSIS — E291 Testicular hypofunction: Secondary | ICD-10-CM

## 2018-05-11 DIAGNOSIS — Z23 Encounter for immunization: Secondary | ICD-10-CM

## 2018-05-11 DIAGNOSIS — E785 Hyperlipidemia, unspecified: Secondary | ICD-10-CM | POA: Diagnosis not present

## 2018-05-11 DIAGNOSIS — I10 Essential (primary) hypertension: Secondary | ICD-10-CM

## 2018-05-11 LAB — CBC WITH DIFFERENTIAL/PLATELET
BASOS PCT: 0.3 % (ref 0.0–3.0)
Basophils Absolute: 0 10*3/uL (ref 0.0–0.1)
EOS ABS: 0.1 10*3/uL (ref 0.0–0.7)
Eosinophils Relative: 0.8 % (ref 0.0–5.0)
HCT: 49.5 % (ref 39.0–52.0)
Hemoglobin: 16.8 g/dL (ref 13.0–17.0)
Lymphocytes Relative: 14.5 % (ref 12.0–46.0)
Lymphs Abs: 1.1 10*3/uL (ref 0.7–4.0)
MCHC: 33.9 g/dL (ref 30.0–36.0)
MCV: 90.7 fl (ref 78.0–100.0)
Monocytes Absolute: 0.6 10*3/uL (ref 0.1–1.0)
Monocytes Relative: 7.2 % (ref 3.0–12.0)
Neutro Abs: 6 10*3/uL (ref 1.4–7.7)
Neutrophils Relative %: 77.2 % — ABNORMAL HIGH (ref 43.0–77.0)
Platelets: 248 10*3/uL (ref 150.0–400.0)
RBC: 5.46 Mil/uL (ref 4.22–5.81)
RDW: 13.7 % (ref 11.5–15.5)
WBC: 7.8 10*3/uL (ref 4.0–10.5)

## 2018-05-11 LAB — URINALYSIS, ROUTINE W REFLEX MICROSCOPIC
Bilirubin Urine: NEGATIVE
Hgb urine dipstick: NEGATIVE
Ketones, ur: 15 — AB
Leukocytes, UA: NEGATIVE
Nitrite: NEGATIVE
Specific Gravity, Urine: 1.01 (ref 1.000–1.030)
Urine Glucose: NEGATIVE
Urobilinogen, UA: 0.2 (ref 0.0–1.0)
pH: 7 (ref 5.0–8.0)

## 2018-05-11 LAB — COMPREHENSIVE METABOLIC PANEL
ALT: 23 U/L (ref 0–53)
AST: 19 U/L (ref 0–37)
Albumin: 4.5 g/dL (ref 3.5–5.2)
Alkaline Phosphatase: 56 U/L (ref 39–117)
BUN: 13 mg/dL (ref 6–23)
CO2: 28 mEq/L (ref 19–32)
Calcium: 9.9 mg/dL (ref 8.4–10.5)
Chloride: 100 mEq/L (ref 96–112)
Creatinine, Ser: 0.97 mg/dL (ref 0.40–1.50)
GFR: 85.27 mL/min (ref >60.00)
Glucose, Bld: 100 mg/dL — ABNORMAL HIGH (ref 70–99)
Potassium: 3.7 mEq/L (ref 3.5–5.1)
Sodium: 139 mEq/L (ref 135–145)
Total Bilirubin: 1 mg/dL (ref 0.2–1.2)
Total Protein: 7.2 g/dL (ref 6.0–8.3)

## 2018-05-11 LAB — LIPID PANEL
Cholesterol: 223 mg/dL — ABNORMAL HIGH (ref 0–200)
HDL: 53.8 mg/dL (ref >39.00)
LDL Cholesterol: 140 mg/dL — ABNORMAL HIGH (ref 0–99)
NonHDL: 169.5
Total CHOL/HDL Ratio: 4
Triglycerides: 149 mg/dL (ref 0.0–149.0)
VLDL: 29.8 mg/dL (ref 0.0–40.0)

## 2018-05-11 LAB — VITAMIN D 25 HYDROXY (VIT D DEFICIENCY, FRACTURES): VITD: 35.3 ng/mL (ref 30.00–100.00)

## 2018-05-11 LAB — PSA: PSA: 0.44 ng/mL (ref 0.10–4.00)

## 2018-05-11 LAB — TSH: TSH: 1.68 u[IU]/mL (ref 0.35–4.50)

## 2018-05-11 MED ORDER — AZILSARTAN-CHLORTHALIDONE 40-12.5 MG PO TABS: 1.0000 | ORAL_TABLET | Freq: Every day | ORAL | 0 refills | Status: DC

## 2018-05-11 NOTE — Patient Instructions (Signed)

## 2018-05-11 NOTE — Progress Notes (Signed)
Subjective:  Patient ID: Lawrence Smith, male    DOB: Apr 12, 1961  Age: 57 y.o. MRN: [id number]  CC: Hypertension and Annual Exam   HPI AKBAR SACRA presents for a CPX.  He has not been taking the valsartan/hydrochlorothiazide regular recently.  He saw some reports online the valsartan had a contaminant that was causing cancer.  He is being treated for melanoma so he became concerned about the connection.  He tells me he has not been notified by his pharmacist that his batch of valsartan has been recalled.  Nonetheless, he is very skeptical and it sounds like he wants to change to something else.  He tells me that at home his blood pressure is usually 122-128/72-74 on the days that he is taking the valsartan hydrochlorothiazide.  He is very active and denies any recent episodes of CP, DOE, palpitations, edema, or fatigue.  Outpatient Medications Prior to Visit  Medication Sig Dispense Refill  . aspirin 81 MG chewable tablet Chew 81 mg by mouth daily.    . tadalafil (CIALIS) 20 MG tablet Take 1 tablet (20 mg total) by mouth daily as needed. 8 tablet 11  . Testosterone 20.25 MG/ACT (1.62%) GEL APPLY 2 PUMPS ONTO THE SKIN DAILY 225 g 1  . valACYclovir (VALTREX) 1000 MG tablet TAKE 1 TABLET BY MOUTH TWICE DAILY. GENERIC EQUIVALENT FOR VALTREX 20 tablet 2  . valACYclovir (VALTREX) 1000 MG tablet TAKE 1 TABLET BY MOUTH TWICE A DAY 20 tablet 1  . valsartan-hydrochlorothiazide (DIOVAN-HCT) 320-12.5 MG tablet Take 1 tablet by mouth daily. 90 tablet 0   No facility-administered medications prior to visit.     ROS Review of Systems  Constitutional: Negative for diaphoresis and fatigue.  HENT: Negative.   Eyes: Negative for visual disturbance.  Respiratory: Negative for apnea, cough, chest tightness, shortness of breath and wheezing.   Cardiovascular: Negative for chest pain, palpitations and leg swelling.  Gastrointestinal: Negative for abdominal pain, constipation, diarrhea, nausea and vomiting.   Endocrine: Negative.   Genitourinary: Negative.  Negative for difficulty urinating, dysuria, penile pain, penile swelling, scrotal swelling, testicular pain and urgency.  Musculoskeletal: Negative.  Negative for arthralgias and myalgias.  Skin: Negative for color change.  Neurological: Negative.  Negative for dizziness, weakness, light-headedness, numbness and headaches.  Hematological: Negative for adenopathy. Does not bruise/bleed easily.  Psychiatric/Behavioral: Negative.     Objective:  BP (!) 138/96 (BP Location: Left Arm, Patient Position: Sitting, Cuff Size: Large)   Pulse 90   Temp 98.4 F (36.9 C) (Oral)   Resp 16   Ht 5\' 10"  (1.778 m)   Wt 201 lb (91.2 kg)   SpO2 98%   BMI 28.84 kg/m   BP Readings from Last 3 Encounters:  05/11/18 (!) 138/96  11/05/17 132/86  04/29/17 (!) 144/90    Wt Readings from Last 3 Encounters:  05/11/18 201 lb (91.2 kg)  11/05/17 202 lb 4 oz (91.7 kg)  04/29/17 200 lb (90.7 kg)    Physical Exam Vitals signs reviewed.  Constitutional:      Appearance: Normal appearance.  HENT:     Nose: Nose normal.     Mouth/Throat:     Mouth: Mucous membranes are moist.     Pharynx: Oropharynx is clear.  Eyes:     Conjunctiva/sclera: Conjunctivae normal.  Neck:     Musculoskeletal: Normal range of motion and neck supple. No muscular tenderness.  Cardiovascular:     Rate and Rhythm: Normal rate and regular rhythm.  Pulses: Normal pulses.     Heart sounds: Normal heart sounds. No murmur. No friction rub. No gallop.      Comments: EKG ----  Sinus  Tachycardia  Low voltage with rightward P-axis and rotation -possible pulmonary disease.   ABNORMAL - normal variant  Pulmonary:     Effort: Pulmonary effort is normal.     Breath sounds: Normal breath sounds. No wheezing, rhonchi or rales.  Abdominal:     General: Abdomen is flat.     Palpations: There is no hepatomegaly or splenomegaly.     Tenderness: There is no abdominal tenderness.      Hernia: No hernia is present. There is no hernia in the right inguinal area or left inguinal area.  Genitourinary:    Pubic Area: No rash.      Penis: Normal and circumcised. No discharge, swelling or lesions.      Scrotum/Testes: Normal.        Right: Mass, tenderness or swelling not present.        Left: Mass, tenderness or swelling not present.     Epididymis:     Right: Normal. No mass.     Left: Normal. No mass.     Prostate: Not enlarged, not tender and no nodules present.     Rectum: Normal. Guaiac result negative. No mass, tenderness, anal fissure, external hemorrhoid or internal hemorrhoid. Normal anal tone.  Lymphadenopathy:     Lower Body: No right inguinal adenopathy. No left inguinal adenopathy.  Neurological:     Mental Status: He is alert.     Lab Results  Component Value Date   WBC 7.8 05/11/2018   HGB 16.8 05/11/2018   HCT 49.5 05/11/2018   PLT 248.0 05/11/2018   GLUCOSE 100 (H) 05/11/2018   CHOL 223 (H) 05/11/2018   TRIG 149.0 05/11/2018   HDL 53.80 05/11/2018   LDLCALC 140 (H) 05/11/2018   ALT 23 05/11/2018   AST 19 05/11/2018   NA 139 05/11/2018   K 3.7 05/11/2018   CL 100 05/11/2018   CREATININE 0.97 05/11/2018   BUN 13 05/11/2018   CO2 28 05/11/2018   TSH 1.68 05/11/2018   PSA 0.44 05/11/2018   HGBA1C 5.5 06/05/2012    No results found.  Assessment & Plan:   Jernard was seen today for hypertension and annual exam.  Diagnoses and all orders for this visit:  Essential hypertension- His blood pressure is not adequately well controlled.  His EKG is negative for LVH or ischemia.  His labs are negative for secondary causes or endorgan damage.  I will change him to [facility] which has not been recalled. -     CBC with Differential/Platelet; Future -     Comprehensive metabolic panel; Future -     TSH; Future -     Urinalysis, Routine w reflex microscopic; Future -     Azilsartan-Chlorthalidone (EDARBYCLOR) 40-12.5 MG TABS; Take 1 tablet by mouth  daily. -     EKG 12-Lead -     VITAMIN D 25 Hydroxy (Vit-D Deficiency, Fractures); Future  Routine general medical examination at a health care facility- Exam completed, labs reviewed, vaccines reviewed and updated, screening for colon cancer is up-to-date. -     Lipid panel; Future -     PSA; Future -     HIV Antibody (routine testing w rflx); Future  Hypogonadism in male- I will monitor his testosterone level and his labs to screen for complications related to testosterone replacement therapy. -  CBC with Differential/Platelet; Future -     Testosterone Total,Free,Bio, Males; Future  Need for influenza vaccination -     Flu Vaccine QUAD 36+ mos IM  Hyperlipidemia LDL goal <130- He does not have an elevated ASCVD risk score so I do not recommend a statin for CV risk reduction.   I have discontinued Loris A. Leinbach's valsartan-hydrochlorothiazide. I am also having him start on Azilsartan-Chlorthalidone. Additionally, I am having him maintain his aspirin, tadalafil, valACYclovir, and Testosterone.  Meds ordered this encounter  Medications  . Azilsartan-Chlorthalidone (EDARBYCLOR) 40-12.5 MG TABS    Sig: Take 1 tablet by mouth daily.    Dispense:  42 tablet    Refill:  0     Follow-up: Return in about 6 years (around 05/11/2024).  Scarlette Calico, MD

## 2018-05-12 ENCOUNTER — Encounter: Payer: Self-pay | Admitting: Internal Medicine

## 2018-05-12 LAB — TESTOSTERONE TOTAL,FREE,BIO, MALES
Albumin: 4.3 g/dL (ref 3.6–5.1)
SEX HORMONE BINDING: 26 nmol/L (ref 10–50)
Testosterone, Bioavailable: 281.8 ng/dL (ref >=110.0)
Testosterone, Free: 143.1 pg/mL (ref 46.0–224.0)
Testosterone: 779 ng/dL (ref 250–827)

## 2018-05-12 LAB — HIV ANTIBODY (ROUTINE TESTING W REFLEX): HIV 1&2 Ab, 4th Generation: NONREACTIVE

## 2018-06-22 ENCOUNTER — Encounter: Payer: Self-pay | Admitting: Internal Medicine

## 2018-06-22 ENCOUNTER — Ambulatory Visit: Payer: BLUE CROSS/BLUE SHIELD | Admitting: Internal Medicine

## 2018-06-22 VITALS — BP 128/88 | HR 74 | Temp 98.2°F | Resp 16 | Ht 70.0 in | Wt 205.8 lb

## 2018-06-22 DIAGNOSIS — I10 Essential (primary) hypertension: Secondary | ICD-10-CM | POA: Diagnosis not present

## 2018-06-22 DIAGNOSIS — N522 Drug-induced erectile dysfunction: Secondary | ICD-10-CM

## 2018-06-22 DIAGNOSIS — E291 Testicular hypofunction: Secondary | ICD-10-CM

## 2018-06-22 MED ORDER — TADALAFIL 20 MG PO TABS: 20.0000 mg | ORAL_TABLET | Freq: Every day | ORAL | 1 refills | Status: DC | PRN

## 2018-06-22 MED ORDER — AZILSARTAN-CHLORTHALIDONE 40-12.5 MG PO TABS: 1.0000 | ORAL_TABLET | Freq: Every day | ORAL | 1 refills | Status: DC

## 2018-06-22 MED ORDER — TESTOSTERONE 20.25 MG/ACT (1.62%) TD GEL: TRANSDERMAL | 1 refills | Status: DC

## 2018-06-22 NOTE — Patient Instructions (Signed)

## 2018-06-22 NOTE — Progress Notes (Signed)
Subjective:  Patient ID: Lawrence Smith, male    DOB: 03/08/1961  Age: 58 y.o. MRN: [id number]  CC: Hypertension   HPI Lawrence Smith presents for a BP check - He tells me his blood pressure has been well controlled on the current combination of an ARB and thiazide diuretic.  He denies any recent episodes of dizziness or lightheadedness.  Outpatient Medications Prior to Visit  Medication Sig Dispense Refill  . aspirin 81 MG chewable tablet Chew 81 mg by mouth daily.    . valACYclovir (VALTREX) 1000 MG tablet TAKE 1 TABLET BY MOUTH TWICE DAILY. GENERIC EQUIVALENT FOR VALTREX 20 tablet 2  . Azilsartan-Chlorthalidone (EDARBYCLOR) 40-12.5 MG TABS Take 1 tablet by mouth daily. 42 tablet 0  . tadalafil (CIALIS) 20 MG tablet Take 1 tablet (20 mg total) by mouth daily as needed. 8 tablet 11  . Testosterone 20.25 MG/ACT (1.62%) GEL APPLY 2 PUMPS ONTO THE SKIN DAILY 225 g 1   No facility-administered medications prior to visit.     ROS Review of Systems  Constitutional: Negative.  Negative for diaphoresis, fatigue and unexpected weight change.  HENT: Negative.   Eyes: Negative for visual disturbance.  Respiratory: Negative for chest tightness, shortness of breath and wheezing.   Cardiovascular: Negative for chest pain, palpitations and leg swelling.  Gastrointestinal: Negative for abdominal pain, constipation, diarrhea, nausea and vomiting.  Endocrine: Negative.   Genitourinary: Negative.  Negative for difficulty urinating and hematuria.  Musculoskeletal: Negative.  Negative for arthralgias and myalgias.  Skin: Negative.  Negative for color change and pallor.  Neurological: Negative.  Negative for dizziness, weakness, light-headedness and headaches.  Hematological: Negative for adenopathy. Does not bruise/bleed easily.  Psychiatric/Behavioral: Negative.     Objective:  BP 128/88 (BP Location: Left Arm, Patient Position: Sitting, Cuff Size: Large)   Pulse 74   Temp 98.2 F (36.8 C)  (Oral)   Resp 16   Ht 5\' 10"  (1.778 m)   Wt 205 lb 12 oz (93.3 kg)   SpO2 93%   BMI 29.52 kg/m   BP Readings from Last 3 Encounters:  06/22/18 128/88  05/11/18 (!) 138/96  11/05/17 132/86    Wt Readings from Last 3 Encounters:  06/22/18 205 lb 12 oz (93.3 kg)  05/11/18 201 lb (91.2 kg)  11/05/17 202 lb 4 oz (91.7 kg)    Physical Exam Vitals signs reviewed.  Constitutional:      Appearance: He is normal weight.  HENT:     Nose: Nose normal. No congestion or rhinorrhea.     Mouth/Throat:     Mouth: Mucous membranes are moist.     Pharynx: Oropharynx is clear. No oropharyngeal exudate or posterior oropharyngeal erythema.  Eyes:     General: No scleral icterus.    Conjunctiva/sclera: Conjunctivae normal.  Neck:     Musculoskeletal: Normal range of motion and neck supple. No muscular tenderness.  Cardiovascular:     Rate and Rhythm: Normal rate and regular rhythm.     Heart sounds: No murmur. No gallop.   Pulmonary:     Breath sounds: No wheezing, rhonchi or rales.  Abdominal:     General: Bowel sounds are normal.     Palpations: There is no mass.     Tenderness: There is no abdominal tenderness. There is no guarding.  Musculoskeletal: Normal range of motion.        General: No swelling.     Right lower leg: No edema.     Left lower  leg: No edema.  Lymphadenopathy:     Cervical: No cervical adenopathy.  Skin:    General: Skin is warm.     Coloration: Skin is not pale.  Neurological:     General: No focal deficit present.     Mental Status: He is alert and oriented to person, place, and time. Mental status is at baseline.     Lab Results  Component Value Date   WBC 7.8 05/11/2018   HGB 16.8 05/11/2018   HCT 49.5 05/11/2018   PLT 248.0 05/11/2018   GLUCOSE 100 (H) 05/11/2018   CHOL 223 (H) 05/11/2018   TRIG 149.0 05/11/2018   HDL 53.80 05/11/2018   LDLCALC 140 (H) 05/11/2018   ALT 23 05/11/2018   AST 19 05/11/2018   NA 139 05/11/2018   K 3.7 05/11/2018    CL 100 05/11/2018   CREATININE 0.97 05/11/2018   BUN 13 05/11/2018   CO2 28 05/11/2018   TSH 1.68 05/11/2018   PSA 0.44 05/11/2018   HGBA1C 5.5 06/05/2012    No results found.  Assessment & Plan:   Lawrence Smith was seen today for hypertension.  Diagnoses and all orders for this visit:  Essential hypertension- His BP is well controlled, will cont the current combination of ARB and thiazide diuretic. -     Azilsartan-Chlorthalidone (EDARBYCLOR) 40-12.5 MG TABS; Take 1 tablet by mouth daily.  Hypogonadism in male -     Testosterone 20.25 MG/ACT (1.62%) GEL; APPLY 2 PUMPS ONTO THE SKIN DAILY  Drug-induced erectile dysfunction -     tadalafil (CIALIS) 20 MG tablet; Take 1 tablet (20 mg total) by mouth daily as needed.  Hypogonadism male -     Testosterone 20.25 MG/ACT (1.62%) GEL; APPLY 2 PUMPS ONTO THE SKIN DAILY   I am having Lawrence Smith maintain his aspirin, valACYclovir, Azilsartan-Chlorthalidone, tadalafil, and Testosterone.  Meds ordered this encounter  Medications  . Azilsartan-Chlorthalidone (EDARBYCLOR) 40-12.5 MG TABS    Sig: Take 1 tablet by mouth daily.    Dispense:  90 tablet    Refill:  1  . tadalafil (CIALIS) 20 MG tablet    Sig: Take 1 tablet (20 mg total) by mouth daily as needed.    Dispense:  24 tablet    Refill:  1  . Testosterone 20.25 MG/ACT (1.62%) GEL    Sig: APPLY 2 PUMPS ONTO THE SKIN DAILY    Dispense:  225 g    Refill:  1     Follow-up: Return in about 6 months (around 12/21/2018).  Scarlette Calico, MD

## 2018-06-25 DIAGNOSIS — C44619 Basal cell carcinoma of skin of left upper limb, including shoulder: Secondary | ICD-10-CM | POA: Diagnosis not present

## 2018-06-26 ENCOUNTER — Encounter: Payer: Self-pay | Admitting: Internal Medicine

## 2018-06-26 DIAGNOSIS — I10 Essential (primary) hypertension: Secondary | ICD-10-CM

## 2018-06-26 MED ORDER — AZILSARTAN-CHLORTHALIDONE 40-12.5 MG PO TABS: 1.0000 | ORAL_TABLET | Freq: Every day | ORAL | 1 refills | Status: DC

## 2018-06-29 ENCOUNTER — Other Ambulatory Visit: Payer: Self-pay | Admitting: Internal Medicine

## 2018-06-29 ENCOUNTER — Encounter: Payer: Self-pay | Admitting: Internal Medicine

## 2018-06-29 DIAGNOSIS — I10 Essential (primary) hypertension: Secondary | ICD-10-CM

## 2018-06-29 MED ORDER — VALSARTAN-HYDROCHLOROTHIAZIDE 320-12.5 MG PO TABS: 1.0000 | ORAL_TABLET | Freq: Every day | ORAL | 1 refills | Status: DC

## 2018-06-30 ENCOUNTER — Encounter: Payer: Self-pay | Admitting: Internal Medicine

## 2018-10-05 DIAGNOSIS — D229 Melanocytic nevi, unspecified: Secondary | ICD-10-CM | POA: Diagnosis not present

## 2018-10-29 DIAGNOSIS — H2513 Age-related nuclear cataract, bilateral: Secondary | ICD-10-CM | POA: Diagnosis not present

## 2018-10-29 DIAGNOSIS — H40033 Anatomical narrow angle, bilateral: Secondary | ICD-10-CM | POA: Diagnosis not present

## 2018-12-02 ENCOUNTER — Telehealth: Payer: Self-pay | Admitting: Internal Medicine

## 2018-12-02 DIAGNOSIS — N522 Drug-induced erectile dysfunction: Secondary | ICD-10-CM

## 2018-12-04 ENCOUNTER — Other Ambulatory Visit: Payer: Self-pay | Admitting: Internal Medicine

## 2018-12-04 DIAGNOSIS — E291 Testicular hypofunction: Secondary | ICD-10-CM

## 2018-12-07 ENCOUNTER — Other Ambulatory Visit: Payer: Self-pay | Admitting: Internal Medicine

## 2018-12-07 DIAGNOSIS — N522 Drug-induced erectile dysfunction: Secondary | ICD-10-CM

## 2018-12-07 DIAGNOSIS — E291 Testicular hypofunction: Secondary | ICD-10-CM

## 2018-12-21 ENCOUNTER — Ambulatory Visit (INDEPENDENT_AMBULATORY_CARE_PROVIDER_SITE_OTHER): Payer: BC Managed Care – PPO | Admitting: Internal Medicine

## 2018-12-21 ENCOUNTER — Other Ambulatory Visit (INDEPENDENT_AMBULATORY_CARE_PROVIDER_SITE_OTHER): Payer: BC Managed Care – PPO

## 2018-12-21 ENCOUNTER — Other Ambulatory Visit: Payer: Self-pay

## 2018-12-21 ENCOUNTER — Encounter: Payer: Self-pay | Admitting: Internal Medicine

## 2018-12-21 VITALS — BP 148/102 | HR 64 | Temp 98.5°F | Resp 16 | Ht 70.0 in | Wt 197.0 lb

## 2018-12-21 DIAGNOSIS — I10 Essential (primary) hypertension: Secondary | ICD-10-CM

## 2018-12-21 DIAGNOSIS — E291 Testicular hypofunction: Secondary | ICD-10-CM

## 2018-12-21 LAB — CBC WITH DIFFERENTIAL/PLATELET
Basophils Absolute: 0 10*3/uL (ref 0.0–0.1)
Basophils Relative: 0.4 % (ref 0.0–3.0)
Eosinophils Absolute: 0.2 10*3/uL (ref 0.0–0.7)
Eosinophils Relative: 3.3 % (ref 0.0–5.0)
HCT: 48.9 % (ref 39.0–52.0)
Hemoglobin: 16.2 g/dL (ref 13.0–17.0)
Lymphocytes Relative: 22 % (ref 12.0–46.0)
Lymphs Abs: 1.5 10*3/uL (ref 0.7–4.0)
MCHC: 33.2 g/dL (ref 30.0–36.0)
MCV: 92 fl (ref 78.0–100.0)
Monocytes Absolute: 0.7 10*3/uL (ref 0.1–1.0)
Monocytes Relative: 9.6 % (ref 3.0–12.0)
Neutro Abs: 4.4 10*3/uL (ref 1.4–7.7)
Neutrophils Relative %: 64.7 % (ref 43.0–77.0)
Platelets: 233 10*3/uL (ref 150.0–400.0)
RBC: 5.31 Mil/uL (ref 4.22–5.81)
RDW: 13.9 % (ref 11.5–15.5)
WBC: 6.8 10*3/uL (ref 4.0–10.5)

## 2018-12-21 MED ORDER — VALSARTAN 320 MG PO TABS: 320.0000 mg | ORAL_TABLET | Freq: Every day | ORAL | 1 refills | Status: DC

## 2018-12-21 MED ORDER — INDAPAMIDE 2.5 MG PO TABS: 2.5000 mg | ORAL_TABLET | Freq: Every day | ORAL | 0 refills | Status: DC

## 2018-12-21 NOTE — Progress Notes (Signed)
Subjective:  Patient ID: Lawrence Smith, male    DOB: [birth year]  Age: 58 y.o. MRN: [id number]  CC: Hypertension   HPI Lawrence Smith presents for a BP check - He is concerned that his blood pressure is not adequately well controlled.  He does a lot of cardiovascular exercise and denies any recent episodes of CP or DOE.  He also denies headache, blurred vision, fatigue, or edema.  He tells me he is compliant with Diovan HCT.  Outpatient Medications Prior to Visit  Medication Sig Dispense Refill  . aspirin 81 MG chewable tablet Chew 81 mg by mouth daily.    . tadalafil (CIALIS) 20 MG tablet TAKE 1 TABLET BY MOUTH DAILY AS NEEDED. GENERIC EQUIVALENT FOR CIALIS 12 tablet 3  . Testosterone 1.62 % GEL APPLY 2 PUMPS ONTO THE SKIN DAILY 225 g 0  . valACYclovir (VALTREX) 1000 MG tablet TAKE 1 TABLET BY MOUTH TWICE DAILY. GENERIC EQUIVALENT FOR VALTREX 20 tablet 2  . valsartan-hydrochlorothiazide (DIOVAN-HCT) 320-12.5 MG tablet Take 1 tablet by mouth daily. 90 tablet 1   No facility-administered medications prior to visit.     ROS Review of Systems  Constitutional: Negative.  Negative for diaphoresis, fatigue and unexpected weight change.  HENT: Negative.   Eyes: Negative for visual disturbance.  Respiratory: Negative for cough, chest tightness, shortness of breath and wheezing.   Cardiovascular: Negative for chest pain, palpitations and leg swelling.  Gastrointestinal: Negative for abdominal pain, constipation, diarrhea, nausea and vomiting.  Endocrine: Negative.   Genitourinary: Negative.  Negative for difficulty urinating, dysuria and hematuria.  Musculoskeletal: Negative.  Negative for arthralgias, back pain and myalgias.  Skin: Negative.   Neurological: Negative for dizziness, weakness, light-headedness and headaches.  Hematological: Negative.   Psychiatric/Behavioral: Negative.     Objective:  BP (!) 148/102 (BP Location: Left Arm, Patient Position: Sitting, Cuff Size: Large)    Pulse 64   Temp 98.5 F (36.9 C) (Oral)   Resp 16   Ht 5\' 10"  (1.778 m)   Wt 197 lb (89.4 kg)   SpO2 97%   BMI 28.27 kg/m   BP Readings from Last 3 Encounters:  12/21/18 (!) 148/102  06/22/18 128/88  05/11/18 (!) 138/96    Wt Readings from Last 3 Encounters:  12/21/18 197 lb (89.4 kg)  06/22/18 205 lb 12 oz (93.3 kg)  05/11/18 201 lb (91.2 kg)    Physical Exam Vitals signs reviewed.  Constitutional:      Appearance: He is not ill-appearing or diaphoretic.  HENT:     Nose: Nose normal.     Mouth/Throat:     Mouth: Mucous membranes are moist.  Eyes:     General: No scleral icterus.    Conjunctiva/sclera: Conjunctivae normal.  Neck:     Musculoskeletal: Normal range of motion. No neck rigidity.  Cardiovascular:     Rate and Rhythm: Normal rate and regular rhythm.     Heart sounds: No murmur.  Pulmonary:     Effort: Pulmonary effort is normal.     Breath sounds: No stridor. No wheezing, rhonchi or rales.  Abdominal:     General: Abdomen is flat. Bowel sounds are normal. There is no distension.     Palpations: There is no hepatomegaly or splenomegaly.     Tenderness: There is no abdominal tenderness.  Musculoskeletal: Normal range of motion.     Right lower leg: No edema.     Left lower leg: No edema.  Lymphadenopathy:  Cervical: No cervical adenopathy.  Skin:    General: Skin is warm and dry.     Coloration: Skin is not pale.  Neurological:     General: No focal deficit present.     Mental Status: He is alert and oriented to person, place, and time.  Psychiatric:        Mood and Affect: Mood normal.        Behavior: Behavior normal.     Lab Results  Component Value Date   WBC 6.8 12/21/2018   HGB 16.2 12/21/2018   HCT 48.9 12/21/2018   PLT 233.0 12/21/2018   GLUCOSE 92 12/21/2018   CHOL 223 (H) 05/11/2018   TRIG 149.0 05/11/2018   HDL 53.80 05/11/2018   LDLCALC 140 (H) 05/11/2018   ALT 23 05/11/2018   AST 19 05/11/2018   NA 139 12/21/2018   K  4.0 12/21/2018   CL 101 12/21/2018   CREATININE 0.95 12/21/2018   BUN 18 12/21/2018   CO2 28 12/21/2018   TSH 1.68 05/11/2018   PSA 0.44 05/11/2018   HGBA1C 5.5 06/05/2012    No results found.  Assessment & Plan:   Lawrence Smith was seen today for hypertension.  Diagnoses and all orders for this visit:  Essential hypertension- His blood pressure is not adequately well controlled.  His labs are negative for secondary causes or endorgan damage.  I have asked him to upgrade to a more potent thiazide diuretic and to stay on the current dose of the ARB. -     valsartan (DIOVAN) 320 MG tablet; Take 1 tablet (320 mg total) by mouth daily. -     indapamide (LOZOL) 2.5 MG tablet; Take 1 tablet (2.5 mg total) by mouth daily. -     CBC with Differential/Platelet; Future -     Basic metabolic panel; Future  Hypogonadism in male- His testosterone level is in the normal range.  Labs are negative for any concerns for toxicity. -     Basic metabolic panel; Future -     Testosterone Total,Free,Bio, Males; Future   I have discontinued Lawrence Smith's valsartan-hydrochlorothiazide. I am also having him start on valsartan and indapamide. Additionally, I am having him maintain his aspirin, valACYclovir, tadalafil, and Testosterone.  Meds ordered this encounter  Medications  . valsartan (DIOVAN) 320 MG tablet    Sig: Take 1 tablet (320 mg total) by mouth daily.    Dispense:  90 tablet    Refill:  1  . indapamide (LOZOL) 2.5 MG tablet    Sig: Take 1 tablet (2.5 mg total) by mouth daily.    Dispense:  90 tablet    Refill:  0     Follow-up: Return in about 3 months (around 03/23/2019).  Scarlette Calico, MD

## 2018-12-21 NOTE — Patient Instructions (Signed)

## 2018-12-22 ENCOUNTER — Encounter: Payer: Self-pay | Admitting: Internal Medicine

## 2018-12-22 LAB — BASIC METABOLIC PANEL
BUN: 18 mg/dL (ref 6–23)
CO2: 28 mEq/L (ref 19–32)
Calcium: 9.7 mg/dL (ref 8.4–10.5)
Chloride: 101 mEq/L (ref 96–112)
Creatinine, Ser: 0.95 mg/dL (ref 0.40–1.50)
GFR: 82 mL/min (ref >60.00)
Glucose, Bld: 92 mg/dL (ref 70–99)
Potassium: 4 mEq/L (ref 3.5–5.1)
Sodium: 139 mEq/L (ref 135–145)

## 2018-12-22 LAB — TESTOSTERONE TOTAL,FREE,BIO, MALES
Albumin: 4.5 g/dL (ref 3.6–5.1)
Sex Hormone Binding: 25 nmol/L (ref 22–77)
Testosterone, Bioavailable: 150.4 ng/dL (ref >=110.0)
Testosterone, Free: 73.1 pg/mL (ref 46.0–224.0)
Testosterone: 442 ng/dL (ref 250–827)

## 2019-01-25 ENCOUNTER — Other Ambulatory Visit: Payer: Self-pay | Admitting: Internal Medicine

## 2019-01-25 DIAGNOSIS — B001 Herpesviral vesicular dermatitis: Secondary | ICD-10-CM

## 2019-01-25 DIAGNOSIS — I10 Essential (primary) hypertension: Secondary | ICD-10-CM

## 2019-02-18 DIAGNOSIS — Z85820 Personal history of malignant melanoma of skin: Secondary | ICD-10-CM | POA: Diagnosis not present

## 2019-02-18 DIAGNOSIS — L821 Other seborrheic keratosis: Secondary | ICD-10-CM | POA: Diagnosis not present

## 2019-02-18 DIAGNOSIS — D229 Melanocytic nevi, unspecified: Secondary | ICD-10-CM | POA: Diagnosis not present

## 2019-02-26 ENCOUNTER — Encounter: Payer: Self-pay | Admitting: Internal Medicine

## 2019-02-28 ENCOUNTER — Other Ambulatory Visit: Payer: Self-pay | Admitting: Internal Medicine

## 2019-02-28 DIAGNOSIS — E291 Testicular hypofunction: Secondary | ICD-10-CM

## 2019-02-28 MED ORDER — TESTOSTERONE 1.62 % TD GEL: 2.0000 | Freq: Every day | TRANSDERMAL | 1 refills | Status: DC

## 2019-03-10 ENCOUNTER — Encounter: Payer: Self-pay | Admitting: Internal Medicine

## 2019-04-04 ENCOUNTER — Other Ambulatory Visit: Payer: Self-pay | Admitting: Internal Medicine

## 2019-04-04 DIAGNOSIS — I10 Essential (primary) hypertension: Secondary | ICD-10-CM

## 2019-04-05 ENCOUNTER — Encounter: Payer: Self-pay | Admitting: Internal Medicine

## 2019-04-05 ENCOUNTER — Other Ambulatory Visit: Payer: Self-pay | Admitting: Internal Medicine

## 2019-04-05 DIAGNOSIS — I10 Essential (primary) hypertension: Secondary | ICD-10-CM

## 2019-04-05 MED ORDER — VALSARTAN 320 MG PO TABS: 320.0000 mg | ORAL_TABLET | Freq: Every day | ORAL | 1 refills | Status: DC

## 2019-04-06 ENCOUNTER — Telehealth: Payer: Self-pay | Admitting: Internal Medicine

## 2019-04-06 NOTE — Telephone Encounter (Signed)
Caller name: Peggy Relation to pt: Call back number: Pharmacy :[facility] [place], [place]  Reason for call:  As per pharmacist patient insurance covers a 1 year supply and not a 90 day. Pharmacist requesting a new Rx for  valsartan (DIOVAN) 320 MG tablet , please advise

## 2019-04-07 NOTE — Telephone Encounter (Signed)
Contacted pharmacy: they stated that they are requesting a year supply (32 with 3 refills). Informed that pt would need to be seen for any additional refills.

## 2019-04-19 ENCOUNTER — Other Ambulatory Visit: Payer: Self-pay

## 2019-04-19 ENCOUNTER — Ambulatory Visit (INDEPENDENT_AMBULATORY_CARE_PROVIDER_SITE_OTHER): Payer: BC Managed Care – PPO | Admitting: Internal Medicine

## 2019-04-19 ENCOUNTER — Encounter: Payer: Self-pay | Admitting: Internal Medicine

## 2019-04-19 ENCOUNTER — Other Ambulatory Visit (INDEPENDENT_AMBULATORY_CARE_PROVIDER_SITE_OTHER): Payer: BC Managed Care – PPO

## 2019-04-19 VITALS — BP 150/90 | HR 99 | Temp 98.7°F | Ht 70.0 in | Wt 201.0 lb

## 2019-04-19 DIAGNOSIS — I10 Essential (primary) hypertension: Secondary | ICD-10-CM

## 2019-04-19 DIAGNOSIS — T502X5A Adverse effect of carbonic-anhydrase inhibitors, benzothiadiazides and other diuretics, initial encounter: Secondary | ICD-10-CM | POA: Diagnosis not present

## 2019-04-19 DIAGNOSIS — E876 Hypokalemia: Secondary | ICD-10-CM | POA: Diagnosis not present

## 2019-04-19 LAB — CBC WITH DIFFERENTIAL/PLATELET
Basophils Absolute: 0 10*3/uL (ref 0.0–0.1)
Basophils Relative: 0.6 % (ref 0.0–3.0)
Eosinophils Absolute: 0.2 10*3/uL (ref 0.0–0.7)
Eosinophils Relative: 1.8 % (ref 0.0–5.0)
HCT: 49.3 % (ref 39.0–52.0)
Hemoglobin: 16.6 g/dL (ref 13.0–17.0)
Lymphocytes Relative: 16.5 % (ref 12.0–46.0)
Lymphs Abs: 1.4 10*3/uL (ref 0.7–4.0)
MCHC: 33.7 g/dL (ref 30.0–36.0)
MCV: 90.6 fl (ref 78.0–100.0)
Monocytes Absolute: 0.8 10*3/uL (ref 0.1–1.0)
Monocytes Relative: 8.7 % (ref 3.0–12.0)
Neutro Abs: 6.3 10*3/uL (ref 1.4–7.7)
Neutrophils Relative %: 72.4 % (ref 43.0–77.0)
Platelets: 241 10*3/uL (ref 150.0–400.0)
RBC: 5.44 Mil/uL (ref 4.22–5.81)
RDW: 13.7 % (ref 11.5–15.5)
WBC: 8.7 10*3/uL (ref 4.0–10.5)

## 2019-04-19 LAB — BASIC METABOLIC PANEL
BUN: 17 mg/dL (ref 6–23)
CO2: 30 mEq/L (ref 19–32)
Calcium: 10 mg/dL (ref 8.4–10.5)
Chloride: 100 mEq/L (ref 96–112)
Creatinine, Ser: 0.9 mg/dL (ref 0.40–1.50)
GFR: 87.18 mL/min (ref >60.00)
Glucose, Bld: 95 mg/dL (ref 70–99)
Potassium: 3.5 mEq/L (ref 3.5–5.1)
Sodium: 138 mEq/L (ref 135–145)

## 2019-04-19 MED ORDER — INDAPAMIDE 2.5 MG PO TABS: 2.5000 mg | ORAL_TABLET | Freq: Every day | ORAL | 1 refills | Status: DC

## 2019-04-19 NOTE — Progress Notes (Signed)
Subjective:  Patient ID: Lawrence Smith, male    DOB: Dec 17, 1960  Age: 58 y.o. MRN: [id number]  CC: Hypertension  This visit occurred during the SARS-CoV-2 public health emergency.  Safety protocols were in place, including screening questions prior to the visit, additional usage of staff PPE, and extensive cleaning of exam room while observing appropriate contact time as indicated for disinfecting solutions.    HPI Lawrence Smith presents for f/up - He tells me his blood pressure at home is consistently around 130/80.  He tells me he gets psychically worked up when he is coming into the office and thinks that there is a significant whitecoat phenomenon when I check his blood pressure here.  He is active and denies any recent episodes of CP or DOE.  Outpatient Medications Prior to Visit  Medication Sig Dispense Refill  . aspirin 81 MG chewable tablet Chew 81 mg by mouth daily.    . tadalafil (CIALIS) 20 MG tablet TAKE 1 TABLET BY MOUTH DAILY AS NEEDED. GENERIC EQUIVALENT FOR CIALIS 12 tablet 3  . Testosterone 1.62 % GEL Apply 2 Pump topically daily. 225 g 1  . valACYclovir (VALTREX) 1000 MG tablet TAKE 1 TABLET BY MOUTH TWICE DAILY. GENERIC EQUIVALENT FOR VALTREX 20 tablet 2  . valsartan (DIOVAN) 320 MG tablet Take 1 tablet (320 mg total) by mouth daily. 90 tablet 1  . indapamide (LOZOL) 2.5 MG tablet TAKE 1 TABLET BY MOUTH DAILY 90 tablet 0   No facility-administered medications prior to visit.     ROS Review of Systems  Constitutional: Negative for diaphoresis and fatigue.  HENT: Negative.   Eyes: Negative for visual disturbance.  Respiratory: Negative for cough, chest tightness, shortness of breath and wheezing.   Cardiovascular: Negative for chest pain, palpitations and leg swelling.  Gastrointestinal: Negative for abdominal pain, diarrhea, nausea and vomiting.  Endocrine: Negative.   Genitourinary: Negative.  Negative for difficulty urinating.  Musculoskeletal: Negative for  arthralgias and myalgias.  Skin: Negative.  Negative for color change.  Neurological: Negative for dizziness, weakness and light-headedness.  Hematological: Negative for adenopathy. Does not bruise/bleed easily.  Psychiatric/Behavioral: Negative.     Objective:  BP (!) 150/90 (BP Location: Left Arm, Patient Position: Sitting, Cuff Size: Normal)   Pulse 99   Temp 98.7 F (37.1 C) (Oral)   Ht 5\' 10"  (1.778 m)   Wt 201 lb (91.2 kg)   SpO2 98%   BMI 28.84 kg/m   BP Readings from Last 3 Encounters:  04/19/19 (!) 150/90  12/21/18 (!) 148/102  06/22/18 128/88    Wt Readings from Last 3 Encounters:  04/19/19 201 lb (91.2 kg)  12/21/18 197 lb (89.4 kg)  06/22/18 205 lb 12 oz (93.3 kg)    Physical Exam Vitals signs reviewed.  Constitutional:      Appearance: Normal appearance.  HENT:     Nose: Nose normal.     Mouth/Throat:     Mouth: Mucous membranes are moist.  Eyes:     General: No scleral icterus.    Conjunctiva/sclera: Conjunctivae normal.  Neck:     Musculoskeletal: Neck supple.  Cardiovascular:     Rate and Rhythm: Normal rate.     Heart sounds: No murmur.  Pulmonary:     Effort: Pulmonary effort is normal.     Breath sounds: No stridor. No wheezing, rhonchi or rales.  Abdominal:     General: Abdomen is flat. Bowel sounds are normal. There is no distension.  Palpations: Abdomen is soft. There is no hepatomegaly or splenomegaly.     Tenderness: There is no abdominal tenderness.  Musculoskeletal: Normal range of motion.     Right lower leg: No edema.     Left lower leg: No edema.  Lymphadenopathy:     Cervical: No cervical adenopathy.  Skin:    General: Skin is warm and dry.  Neurological:     General: No focal deficit present.     Mental Status: He is alert.  Psychiatric:        Mood and Affect: Mood normal.        Behavior: Behavior normal.     Lab Results  Component Value Date   WBC 8.7 04/19/2019   HGB 16.6 04/19/2019   HCT 49.3 04/19/2019    PLT 241.0 04/19/2019   GLUCOSE 95 04/19/2019   CHOL 223 (H) 05/11/2018   TRIG 149.0 05/11/2018   HDL 53.80 05/11/2018   LDLCALC 140 (H) 05/11/2018   ALT 23 05/11/2018   AST 19 05/11/2018   NA 138 04/19/2019   K 3.5 04/19/2019   CL 100 04/19/2019   CREATININE 0.90 04/19/2019   BUN 17 04/19/2019   CO2 30 04/19/2019   TSH 1.68 05/11/2018   PSA 0.44 05/11/2018   HGBA1C 5.5 06/05/2012    No results found.  Assessment & Plan:   Lawrence Smith was seen today for hypertension.  Diagnoses and all orders for this visit:  Essential hypertension- According to his blood pressure in the office he is not adequately well controlled but I think that this is the whitecoat phenomenon.  I think his blood pressure is actually well controlled on the combination of the thiazide diuretic and ARB.  I have asked him to continue the current regimen.  He has developed mild hypokalemia with a potassium level of 3.5.  I have asked him to start taking a potassium supplement. -     indapamide (LOZOL) 2.5 MG tablet; Take 1 tablet (2.5 mg total) by mouth daily. -     CBC with Differential; Future -     Basic metabolic panel; Future -     potassium chloride SA (KLOR-CON) 20 MEQ tablet; Take 1 tablet (20 mEq total) by mouth 2 (two) times daily.  Diuretic-induced hypokalemia -     potassium chloride SA (KLOR-CON) 20 MEQ tablet; Take 1 tablet (20 mEq total) by mouth 2 (two) times daily.   I have changed Lawrence Smith's indapamide. I am also having him start on potassium chloride SA. Additionally, I am having him maintain his aspirin, tadalafil, valACYclovir, Testosterone, and valsartan.  Meds ordered this encounter  Medications  . indapamide (LOZOL) 2.5 MG tablet    Sig: Take 1 tablet (2.5 mg total) by mouth daily.    Dispense:  90 tablet    Refill:  1  . potassium chloride SA (KLOR-CON) 20 MEQ tablet    Sig: Take 1 tablet (20 mEq total) by mouth 2 (two) times daily.    Dispense:  180 tablet    Refill:  1      Follow-up: Return in about 6 months (around 10/17/2019).  Scarlette Calico, MD

## 2019-04-19 NOTE — Patient Instructions (Signed)

## 2019-04-20 ENCOUNTER — Encounter: Payer: Self-pay | Admitting: Internal Medicine

## 2019-04-20 DIAGNOSIS — E876 Hypokalemia: Secondary | ICD-10-CM | POA: Insufficient documentation

## 2019-04-20 DIAGNOSIS — T502X5A Adverse effect of carbonic-anhydrase inhibitors, benzothiadiazides and other diuretics, initial encounter: Secondary | ICD-10-CM | POA: Insufficient documentation

## 2019-04-20 MED ORDER — POTASSIUM CHLORIDE CRYS ER 20 MEQ PO TBCR: 20.0000 meq | EXTENDED_RELEASE_TABLET | Freq: Two times a day (BID) | ORAL | 1 refills | Status: DC

## 2019-06-01 ENCOUNTER — Encounter: Payer: Self-pay | Admitting: Internal Medicine

## 2019-06-01 ENCOUNTER — Other Ambulatory Visit: Payer: Self-pay | Admitting: Internal Medicine

## 2019-06-01 DIAGNOSIS — N522 Drug-induced erectile dysfunction: Secondary | ICD-10-CM

## 2019-06-01 MED ORDER — TADALAFIL 20 MG PO TABS: ORAL_TABLET | ORAL | 5 refills | Status: DC

## 2019-06-02 ENCOUNTER — Telehealth: Payer: Self-pay

## 2019-06-02 NOTE — Telephone Encounter (Signed)
[employer] [place] requires TWO early morning baseline serum total or free serum testosterone lab results that have been taken on different dates to be submitted for review. Please enter the lab values in the table on the next page OR combine these laboratory results into one file and upload here. If you are unable to combine the laboratory results into one file, please fax the additional document(s) to [phone]. Please include the CoverMyMeds request key on all faxed pages. (Must be .jpg, .pdf, or .tif file format)

## 2019-06-03 ENCOUNTER — Ambulatory Visit: Payer: BC Managed Care – PPO | Attending: Internal Medicine

## 2019-06-03 DIAGNOSIS — Z20822 Contact with and (suspected) exposure to covid-19: Secondary | ICD-10-CM

## 2019-06-04 ENCOUNTER — Encounter: Payer: Self-pay | Admitting: Internal Medicine

## 2019-06-04 DIAGNOSIS — E291 Testicular hypofunction: Secondary | ICD-10-CM

## 2019-06-05 ENCOUNTER — Other Ambulatory Visit: Payer: Self-pay | Admitting: Internal Medicine

## 2019-06-05 DIAGNOSIS — E291 Testicular hypofunction: Secondary | ICD-10-CM

## 2019-06-05 LAB — NOVEL CORONAVIRUS, NAA: SARS-CoV-2, NAA: NOT DETECTED

## 2019-06-05 MED ORDER — TESTOSTERONE 1.62 % TD GEL: 2.0000 | Freq: Every day | TRANSDERMAL | 1 refills | Status: DC

## 2019-06-07 NOTE — Telephone Encounter (Signed)
Called [employer] to ask if member was to stop the current testosterone therapy to have the testosterone labs completed or should he continue therapy and have labs done.   [phone] is the number for pharmacy utilization.

## 2019-06-09 ENCOUNTER — Other Ambulatory Visit: Payer: Self-pay

## 2019-06-09 DIAGNOSIS — E291 Testicular hypofunction: Secondary | ICD-10-CM | POA: Diagnosis not present

## 2019-06-10 ENCOUNTER — Other Ambulatory Visit: Payer: BC Managed Care – PPO

## 2019-06-10 DIAGNOSIS — E291 Testicular hypofunction: Secondary | ICD-10-CM | POA: Diagnosis not present

## 2019-06-10 LAB — TESTOSTERONE TOTAL,FREE,BIO, MALES
Albumin: 4.2 g/dL (ref 3.6–5.1)
Sex Hormone Binding: 24 nmol/L (ref 22–77)
Testosterone: 133 ng/dL — ABNORMAL LOW (ref 250–827)

## 2019-06-11 ENCOUNTER — Encounter: Payer: Self-pay | Admitting: Internal Medicine

## 2019-06-11 LAB — TESTOSTERONE TOTAL,FREE,BIO, MALES
Albumin: 4.2 g/dL (ref 3.6–5.1)
Sex Hormone Binding: 20 nmol/L — ABNORMAL LOW (ref 22–77)
Testosterone: 138 ng/dL — ABNORMAL LOW (ref 250–827)

## 2019-06-15 ENCOUNTER — Other Ambulatory Visit: Payer: Self-pay | Admitting: Internal Medicine

## 2019-06-15 DIAGNOSIS — E291 Testicular hypofunction: Secondary | ICD-10-CM

## 2019-07-23 ENCOUNTER — Ambulatory Visit: Payer: BLUE CROSS/BLUE SHIELD | Attending: Internal Medicine

## 2019-07-23 DIAGNOSIS — Z20822 Contact with and (suspected) exposure to covid-19: Secondary | ICD-10-CM

## 2019-07-24 LAB — NOVEL CORONAVIRUS, NAA: SARS-CoV-2, NAA: DETECTED — AB

## 2019-07-25 ENCOUNTER — Other Ambulatory Visit: Payer: Self-pay | Admitting: Internal Medicine

## 2019-07-25 ENCOUNTER — Ambulatory Visit (HOSPITAL_COMMUNITY)
Admission: RE | Admit: 2019-07-25 | Discharge: 2019-07-25 | Disposition: A | Discharge status: Home / Self Care | Payer: BLUE CROSS/BLUE SHIELD | Source: Ambulatory Visit | Attending: Pulmonary Disease | Admitting: Pulmonary Disease

## 2019-07-25 ENCOUNTER — Telehealth: Payer: Self-pay | Admitting: Adult Health

## 2019-07-25 DIAGNOSIS — U071 COVID-19: Secondary | ICD-10-CM | POA: Insufficient documentation

## 2019-07-25 DIAGNOSIS — I10 Essential (primary) hypertension: Secondary | ICD-10-CM

## 2019-07-25 MED ORDER — SODIUM CHLORIDE 0.9 % IV SOLN
INTRAVENOUS | Status: DC | PRN
  Administered 2019-07-25: 250 mL via INTRAVENOUS

## 2019-07-25 MED ORDER — EPINEPHRINE 0.3 MG/0.3ML IJ SOAJ: 0.3000 mg | Freq: Once | INTRAMUSCULAR | Status: DC | PRN

## 2019-07-25 MED ORDER — METHYLPREDNISOLONE SODIUM SUCC 125 MG IJ SOLR: 125.0000 mg | Freq: Once | INTRAMUSCULAR | Status: DC | PRN

## 2019-07-25 MED ORDER — ALBUTEROL SULFATE HFA 108 (90 BASE) MCG/ACT IN AERS: 2.0000 | INHALATION_SPRAY | Freq: Once | RESPIRATORY_TRACT | Status: DC | PRN

## 2019-07-25 MED ORDER — SODIUM CHLORIDE 0.9 % IV SOLN
700.0000 mg | Freq: Once | INTRAVENOUS | Status: AC
  Administered 2019-07-25: 700 mg via INTRAVENOUS
  Filled 2019-07-25: qty 20

## 2019-07-25 MED ORDER — FAMOTIDINE IN NACL 20-0.9 MG/50ML-% IV SOLN: 20.0000 mg | Freq: Once | INTRAVENOUS | Status: DC | PRN

## 2019-07-25 MED ORDER — DIPHENHYDRAMINE HCL 50 MG/ML IJ SOLN: 50.0000 mg | Freq: Once | INTRAMUSCULAR | Status: DC | PRN

## 2019-07-25 NOTE — Progress Notes (Signed)
  I connected by phone with Lawrence Smith on 07/25/2019 at 9:40 AM to discuss the potential use of an new treatment for mild to moderate COVID-19 viral infection in non-hospitalized patients.  This patient is a 59 y.o. male that meets the FDA criteria for Emergency Use Authorization of bamlanivimab or casirivimab\imdevimab.  Has a (+) direct SARS-CoV-2 viral test result  Has mild or moderate COVID-19   Is ? 57 years of age and weighs ? 40 kg  Is NOT hospitalized due to COVID-19  Is NOT requiring oxygen therapy or requiring an increase in baseline oxygen flow rate due to COVID-19  Is within 10 days of symptom onset  Has at least one of the high risk factor(s) for progression to severe COVID-19 and/or hospitalization as defined in EUA.  Specific high risk criteria : Hypertension   I have spoken and communicated the following to the patient or parent/caregiver:  1. FDA has authorized the emergency use of bamlanivimab and casirivimab\imdevimab for the treatment of mild to moderate COVID-19 in adults and pediatric patients with positive results of direct SARS-CoV-2 viral testing who are 55 years of age and older weighing at least 40 kg, and who are at high risk for progressing to severe COVID-19 and/or hospitalization.  2. The significant known and potential risks and benefits of bamlanivimab and casirivimab\imdevimab, and the extent to which such potential risks and benefits are unknown.  3. Information on available alternative treatments and the risks and benefits of those alternatives, including clinical trials.  4. Patients treated with bamlanivimab and casirivimab\imdevimab should continue to self-isolate and use infection control measures (e.g., wear mask, isolate, social distance, avoid sharing personal items, clean and disinfect "high touch" surfaces, and frequent handwashing) according to CDC guidelines.   5. The patient or parent/caregiver has the option to accept or refuse  bamlanivimab or casirivimab\imdevimab .  After reviewing this information with the patient, The patient agreed to proceed with receiving the bamlanimivab infusion and will be provided a copy of the Fact sheet prior to receiving the infusion.   Infusion scheduled for 2/28 at 10:30am. Day 5 of symptoms  Alan Ripper, NP-C [place]

## 2019-07-25 NOTE — Progress Notes (Signed)
  Diagnosis: COVID-19  Physician:dr wright  Procedure: [facility] Med: bamlanivimab infusion - Provided patient with bamlanimivab fact sheet for patients, parents and caregivers prior to infusion.  Complications: No immediate complications noted.  Discharge: Discharged home   [place] 07/25/2019

## 2019-07-25 NOTE — Discharge Instructions (Signed)

## 2019-07-25 NOTE — Telephone Encounter (Signed)
Called to discuss with [name] about Covid symptoms and the use of bamlanivimab, a monoclonal antibody infusion for those with mild to moderate Covid symptoms and at a high risk of hospitalization.     Pt is qualified for this infusion at the [facility] infusion center due to co-morbid conditions and/or a member of an at-risk group, however declines infusion at this time. Symptoms tier reviewed as well as criteria for ending isolation.  Symptoms reviewed that would warrant ED/Hospital evaluation. Preventative practices reviewed. Patient verbalized understanding. Patient advised to call back if he decides that he does want to get infusion. Callback number to the infusion center given. Patient advised to go to Urgent care or ED with severe symptoms. Last date he  would be eligible for infusion is 07/31/19     Patient Active Problem List   Diagnosis Date Noted  . Diuretic-induced hypokalemia 04/20/2019  . Hyperlipidemia LDL goal <130 11/05/2017  . Herpes labialis 06/17/2013  . Routine general medical examination at a health care facility 06/05/2012  . Nonspecific abnormal electrocardiogram (ECG) (EKG) 06/05/2012  . BPH with obstruction/lower urinary tract symptoms 12/06/2011  . Hypogonadism in male 06/08/2009  . 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 Essential hypertension 06/16/2008    Nikiyah Fackler NP-C

## 2019-08-24 ENCOUNTER — Encounter: Payer: Self-pay | Admitting: Internal Medicine

## 2019-09-02 ENCOUNTER — Other Ambulatory Visit: Payer: Self-pay | Admitting: Internal Medicine

## 2019-09-02 ENCOUNTER — Encounter: Payer: Self-pay | Admitting: Internal Medicine

## 2019-09-02 DIAGNOSIS — E291 Testicular hypofunction: Secondary | ICD-10-CM

## 2019-09-02 DIAGNOSIS — I10 Essential (primary) hypertension: Secondary | ICD-10-CM

## 2019-09-02 MED ORDER — TESTOSTERONE 1.62 % TD GEL: 2.0000 | Freq: Every day | TRANSDERMAL | 1 refills | Status: DC

## 2019-09-02 MED ORDER — VALSARTAN 320 MG PO TABS: 320.0000 mg | ORAL_TABLET | Freq: Every day | ORAL | 1 refills | Status: DC

## 2019-09-02 NOTE — Telephone Encounter (Signed)
Refill req for valsartan and testosterone to new pharmacy: [facility]. erx has already been sent.

## 2019-09-10 ENCOUNTER — Ambulatory Visit: Payer: BC Managed Care – PPO | Admitting: Physician Assistant

## 2019-09-10 ENCOUNTER — Other Ambulatory Visit: Payer: Self-pay

## 2019-09-10 ENCOUNTER — Encounter: Payer: Self-pay | Admitting: Physician Assistant

## 2019-09-10 DIAGNOSIS — Z85828 Personal history of other malignant neoplasm of skin: Secondary | ICD-10-CM

## 2019-09-10 DIAGNOSIS — D492 Neoplasm of unspecified behavior of bone, soft tissue, and skin: Secondary | ICD-10-CM

## 2019-09-10 DIAGNOSIS — D485 Neoplasm of uncertain behavior of skin: Secondary | ICD-10-CM

## 2019-09-10 DIAGNOSIS — C4441 Basal cell carcinoma of skin of scalp and neck: Secondary | ICD-10-CM | POA: Diagnosis not present

## 2019-09-10 DIAGNOSIS — Z85820 Personal history of malignant melanoma of skin: Secondary | ICD-10-CM | POA: Diagnosis not present

## 2019-09-10 DIAGNOSIS — C4491 Basal cell carcinoma of skin, unspecified: Secondary | ICD-10-CM

## 2019-09-10 HISTORY — DX: Basal cell carcinoma of skin, unspecified: C44.91

## 2019-09-10 NOTE — Progress Notes (Signed)
   Follow up Visit  Subjective  Lawrence Smith is a 59 y.o. male who presents for the following: Follow-up (no new concerns). He has a history of melanoma on his back and a BCC in 2019. Both are doing well. No issues. No new spots of concern.   Objective  Well appearing patient in no apparent distress; mood and affect are within normal limits.  All skin waist up examined. No suspicious moles noted on back. Surgical scar examined no sign of recurrence back and left upper arm.  Objective  Right Parietal Scalp: 10.5 to upper ear junction 18.5 to right inner brow Brown papule with irregular color.         Objective  Mid Back: Dyspigmented scar.   Objective  Left Upper Arm - Anterior: BCC.Dyspigmented scar. Appears clear.   Assessment & Plan  Neoplasm of skin Right Parietal Scalp  Skin / nail biopsy Type of biopsy: tangential   Informed consent: discussed and consent obtained   Instrument used: flexible razor blade   Hemostasis achieved with: ferric subsulfate   Outcome: patient tolerated procedure well   Post-procedure details: wound care instructions given    Specimen 1 - Surgical pathology Differential Diagnosis: atypia Check Margins: No  Personal history of malignant melanoma of skin Mid Back  watch  Personal history of skin cancer Left Upper Arm - Anterior  watch

## 2019-09-10 NOTE — Patient Instructions (Signed)

## 2019-09-17 ENCOUNTER — Telehealth: Payer: Self-pay

## 2019-09-17 NOTE — Telephone Encounter (Signed)
Pathology results to patient.  58min surgery w/ JCB on 10/14/2019 at 1:30pm.

## 2019-09-17 NOTE — Telephone Encounter (Signed)
-----   Message from Arlyss Gandy, [place] sent at 09/16/2019  1:44 PM EDT ----- Pigmented BCC. Needs 30 min with me. JCB

## 2019-10-14 ENCOUNTER — Ambulatory Visit (INDEPENDENT_AMBULATORY_CARE_PROVIDER_SITE_OTHER): Payer: BC Managed Care – PPO | Admitting: Physician Assistant

## 2019-10-14 ENCOUNTER — Encounter: Payer: Self-pay | Admitting: Physician Assistant

## 2019-10-14 ENCOUNTER — Other Ambulatory Visit: Payer: Self-pay

## 2019-10-14 DIAGNOSIS — Z85828 Personal history of other malignant neoplasm of skin: Secondary | ICD-10-CM

## 2019-10-14 DIAGNOSIS — Z85820 Personal history of malignant melanoma of skin: Secondary | ICD-10-CM

## 2019-10-14 DIAGNOSIS — C4441 Basal cell carcinoma of skin of scalp and neck: Secondary | ICD-10-CM

## 2019-10-14 NOTE — Patient Instructions (Signed)

## 2019-10-14 NOTE — Progress Notes (Signed)
   Follow up Visit  Subjective  Lawrence Smith is a 59 y.o. male who presents for the following: Procedure (here for surgery bcc pigmented right parietal scalp). No problem with this area since the biopsy.   Objective  Well appearing patient in no apparent distress; mood and affect are within normal limits.  A focused examination was performed including scalp. Relevant physical exam findings are noted in the Assessment and Plan.   Objective  Right Parietal Scalp: Biopsy scar identified.  Curet x 3, 13fu Size 1.2cm  Assessment & Plan  Basal cell carcinoma (BCC) of scalp Right Parietal Scalp  Destruction of lesion Complexity: simple   Destruction method: electrodesiccation and curettage   Informed consent: discussed and consent obtained   Timeout:  patient name, date of birth, surgical site, and procedure verified Anesthesia: the lesion was anesthetized in a standard fashion   Anesthetic:  1% lidocaine w/ epinephrine 1-100,000 local infiltration Curettage performed in three different directions: Yes   Lesion length (cm):  1.2 Lesion width (cm):  1 Margin per side (cm):  0 Final wound size (cm):  1.2 Hemostasis achieved with:  ferric subsulfate Outcome: patient tolerated procedure well with no complications   Post-procedure details: wound care instructions given   Additional details:  Wound innoculated with 5 fluorouracil solution.  History of basal cell carcinoma (BCC) Left Upper Arm - Anterior  Personal history of malignant melanoma of skin Mid Back

## 2019-10-18 ENCOUNTER — Encounter: Payer: Self-pay | Admitting: Internal Medicine

## 2019-10-18 ENCOUNTER — Other Ambulatory Visit: Payer: Self-pay

## 2019-10-18 ENCOUNTER — Ambulatory Visit: Payer: BC Managed Care – PPO | Admitting: Internal Medicine

## 2019-10-18 VITALS — BP 126/86 | HR 94 | Temp 98.1°F | Ht 70.0 in | Wt 201.0 lb

## 2019-10-18 DIAGNOSIS — E876 Hypokalemia: Secondary | ICD-10-CM | POA: Diagnosis not present

## 2019-10-18 DIAGNOSIS — E785 Hyperlipidemia, unspecified: Secondary | ICD-10-CM

## 2019-10-18 DIAGNOSIS — E291 Testicular hypofunction: Secondary | ICD-10-CM | POA: Diagnosis not present

## 2019-10-18 DIAGNOSIS — N138 Other obstructive and reflux uropathy: Secondary | ICD-10-CM

## 2019-10-18 DIAGNOSIS — Z0000 Encounter for general adult medical examination without abnormal findings: Secondary | ICD-10-CM | POA: Diagnosis not present

## 2019-10-18 DIAGNOSIS — Z23 Encounter for immunization: Secondary | ICD-10-CM

## 2019-10-18 DIAGNOSIS — I10 Essential (primary) hypertension: Secondary | ICD-10-CM

## 2019-10-18 DIAGNOSIS — N401 Enlarged prostate with lower urinary tract symptoms: Secondary | ICD-10-CM

## 2019-10-18 DIAGNOSIS — T502X5A Adverse effect of carbonic-anhydrase inhibitors, benzothiadiazides and other diuretics, initial encounter: Secondary | ICD-10-CM

## 2019-10-18 NOTE — Progress Notes (Signed)
Subjective:  Patient ID: Lawrence Smith, male    DOB: Jun 27, 1960  Age: 59 y.o. MRN: [id number]  CC: Annual Exam and Hypertension  This visit occurred during the SARS-CoV-2 public health emergency.  Safety protocols were in place, including screening questions prior to the visit, additional usage of staff PPE, and extensive cleaning of exam room while observing appropriate contact time as indicated for disinfecting solutions.    HPI Lawrence Smith presents for a CPX.  He tells me his blood pressure has been well controlled.  He is very active and denies any recent episodes of chest pain, shortness of breath, palpitations, edema, or fatigue.  Outpatient Medications Prior to Visit  Medication Sig Dispense Refill  . aspirin 81 MG chewable tablet Chew 81 mg by mouth daily.    . indapamide (LOZOL) 2.5 MG tablet Take 1 tablet (2.5 mg total) by mouth daily. 90 tablet 1  . tadalafil (CIALIS) 20 MG tablet TAKE 1 TABLET BY MOUTH DAILY AS NEEDED. GENERIC EQUIVALENT FOR CIALIS 12 tablet 5  . Testosterone 1.62 % GEL Apply 2 Pump topically daily. 225 g 1  . valACYclovir (VALTREX) 1000 MG tablet TAKE 1 TABLET BY MOUTH TWICE DAILY. GENERIC EQUIVALENT FOR VALTREX 20 tablet 2  . valsartan (DIOVAN) 320 MG tablet Take 320 mg by mouth daily.    . potassium chloride SA (KLOR-CON) 20 MEQ tablet Take 1 tablet (20 mEq total) by mouth 2 (two) times daily. 180 tablet 1   No facility-administered medications prior to visit.    ROS Review of Systems  Constitutional: Negative.  Negative for chills, diaphoresis, fatigue and fever.  HENT: Negative.   Eyes: Negative.  Negative for visual disturbance.  Respiratory: Negative for cough, chest tightness, shortness of breath and wheezing.   Cardiovascular: Negative for chest pain, palpitations and leg swelling.  Gastrointestinal: Negative for abdominal pain, blood in stool, constipation, diarrhea, nausea and vomiting.  Endocrine: Negative.   Genitourinary: Negative.   Negative for difficulty urinating, scrotal swelling and testicular pain.  Musculoskeletal: Negative.  Negative for arthralgias and myalgias.  Skin: Negative.  Negative for color change and pallor.  Neurological: Negative for dizziness, weakness, light-headedness and headaches.  Hematological: Negative for adenopathy. Does not bruise/bleed easily.  Psychiatric/Behavioral: Negative.     Objective:  BP 126/86 (BP Location: Left Arm, Patient Position: Sitting, Cuff Size: Large)   Pulse 94   Temp 98.1 F (36.7 C) (Oral)   Ht 5\' 10"  (1.778 m)   Wt 201 lb (91.2 kg)   SpO2 97%   BMI 28.84 kg/m   BP Readings from Last 3 Encounters:  10/18/19 126/86  07/25/19 (!) 131/91  04/19/19 (!) 150/90    Wt Readings from Last 3 Encounters:  10/18/19 201 lb (91.2 kg)  04/19/19 201 lb (91.2 kg)  12/21/18 197 lb (89.4 kg)    Physical Exam Vitals reviewed.  Constitutional:      Appearance: Normal appearance.  HENT:     Nose: Nose normal.     Mouth/Throat:     Mouth: Mucous membranes are moist.  Eyes:     General: No scleral icterus.    Conjunctiva/sclera: Conjunctivae normal.  Cardiovascular:     Rate and Rhythm: Normal rate and regular rhythm.     Heart sounds: No murmur.  Pulmonary:     Effort: Pulmonary effort is normal.     Breath sounds: No stridor. No wheezing or rhonchi.  Abdominal:     General: Abdomen is flat. Bowel sounds are normal.  There is no distension.     Palpations: Abdomen is soft. There is no hepatomegaly, splenomegaly or mass.     Tenderness: There is no abdominal tenderness.     Hernia: No hernia is present. There is no hernia in the left inguinal area or right inguinal area.  Genitourinary:    Pubic Area: No rash.      Penis: Normal. No discharge, swelling or lesions.      Testes: Normal.        Right: Mass, tenderness or swelling not present.        Left: Mass, tenderness or swelling not present.     Epididymis:     Right: Normal.     Left: Normal.      Prostate: Normal. Not enlarged, not tender and no nodules present.     Rectum: Normal. Guaiac result negative. No mass, tenderness, anal fissure, external hemorrhoid or internal hemorrhoid. Normal anal tone.  Musculoskeletal:        General: Normal range of motion.     Cervical back: Neck supple.     Right lower leg: No edema.     Left lower leg: No edema.  Lymphadenopathy:     Cervical: No cervical adenopathy.     Lower Body: No right inguinal adenopathy. No left inguinal adenopathy.  Skin:    General: Skin is warm and dry.     Coloration: Skin is not pale.     Findings: No rash.  Neurological:     General: No focal deficit present.     Mental Status: He is alert and oriented to person, place, and time. Mental status is at baseline.  Psychiatric:        Mood and Affect: Mood normal.        Behavior: Behavior normal.     Lab Results  Component Value Date   WBC 8.7 04/19/2019   HGB 16.6 04/19/2019   HCT 49.3 04/19/2019   PLT 241.0 04/19/2019   GLUCOSE 102 (H) 10/18/2019   CHOL 222 (H) 10/18/2019   TRIG 104.0 10/18/2019   HDL 48.50 10/18/2019   LDLCALC 153 (H) 10/18/2019   ALT 21 10/18/2019   AST 20 10/18/2019   NA 138 10/18/2019   K 3.4 (L) 10/18/2019   CL 100 10/18/2019   CREATININE 1.03 10/18/2019   BUN 21 10/18/2019   CO2 31 10/18/2019   TSH 1.68 05/11/2018   PSA 0.77 10/18/2019   HGBA1C 5.5 06/05/2012    No results found.  Assessment & Plan:   Ishaan was seen today for annual exam and hypertension.  Diagnoses and all orders for this visit:  Essential hypertension- His blood pressure is adequately well controlled.  He has mild hypokalemia so I have asked him to be more compliant with his potassium supplement. -     CBC with Differential/Platelet; Future -     Basic metabolic panel; Future -     Basic metabolic panel -     CBC with Differential/Platelet -     potassium chloride SA (KLOR-CON) 20 MEQ tablet; Take 1 tablet (20 mEq total) by mouth 2 (two) times  daily.  Routine general medical examination at a health care facility- Exam completed, labs reviewed, vaccines reviewed and updated, cancer screenings are up-to-date, patient education material was given. -     Lipid panel; Future -     PSA; Future -     PSA -     Lipid panel  BPH with obstruction/lower urinary tract symptoms-  His PSA is low which is a reassuring sign that he does not have prostate cancer.  He has no symptoms that need to be treated.  Hypogonadism in male- His testosterone level is in the normal range.  No complications noted with respect to testosterone replacement therapy. -     CBC with Differential/Platelet; Future -     Testosterone Total,Free,Bio, Males; Future -     Testosterone Total,Free,Bio, Males -     CBC with Differential/Platelet  Diuretic-induced hypokalemia- He agrees to be more compliant with his potassium replacement therapy. -     Basic metabolic panel; Future -     Basic metabolic panel -     potassium chloride SA (KLOR-CON) 20 MEQ tablet; Take 1 tablet (20 mEq total) by mouth 2 (two) times daily.  Hyperlipidemia LDL goal <130- His ASCVD risk score is only 8%.  I therefore do not recommend a statin for CV risk reduction. -     Hepatic function panel; Future -     Hepatic function panel  Need for Tdap vaccination -     Tdap vaccine greater than or equal to 7yo IM   I am having Darus A. Wallner maintain his aspirin, valACYclovir, indapamide, tadalafil, Testosterone, valsartan, and potassium chloride SA.  Meds ordered this encounter  Medications  . potassium chloride SA (KLOR-CON) 20 MEQ tablet    Sig: Take 1 tablet (20 mEq total) by mouth 2 (two) times daily.    Dispense:  180 tablet    Refill:  1   In addition to time spent on CPE, I spent 50 minutes in preparing to see the patient by review of recent labs, imaging and procedures, obtaining and reviewing separately obtained history, communicating with the patient and family or caregiver,  ordering medications, tests or procedures, and documenting clinical information in the EHR including the differential Dx, treatment, and any further evaluation and other management of 1. Essential hypertension 2. BPH with obstruction/lower urinary tract symptoms 3. Hypogonadism in male 4. Diuretic-induced hypokalemia 5. Hyperlipidemia LDL goal <130     Follow-up: Return in about 6 months (around 04/19/2020).  Scarlette Calico, MD

## 2019-10-18 NOTE — Patient Instructions (Signed)

## 2019-10-19 ENCOUNTER — Encounter: Payer: Self-pay | Admitting: Internal Medicine

## 2019-10-19 LAB — LIPID PANEL
Cholesterol: 222 mg/dL — ABNORMAL HIGH (ref 0–200)
HDL: 48.5 mg/dL (ref >39.00)
LDL Cholesterol: 153 mg/dL — ABNORMAL HIGH (ref 0–99)
NonHDL: 173.56
Total CHOL/HDL Ratio: 5
Triglycerides: 104 mg/dL (ref 0.0–149.0)
VLDL: 20.8 mg/dL (ref 0.0–40.0)

## 2019-10-19 LAB — BASIC METABOLIC PANEL
BUN: 21 mg/dL (ref 6–23)
CO2: 31 mEq/L (ref 19–32)
Calcium: 9.5 mg/dL (ref 8.4–10.5)
Chloride: 100 mEq/L (ref 96–112)
Creatinine, Ser: 1.03 mg/dL (ref 0.40–1.50)
GFR: 74.47 mL/min (ref >60.00)
Glucose, Bld: 102 mg/dL — ABNORMAL HIGH (ref 70–99)
Potassium: 3.4 mEq/L — ABNORMAL LOW (ref 3.5–5.1)
Sodium: 138 mEq/L (ref 135–145)

## 2019-10-19 LAB — HEPATIC FUNCTION PANEL
ALT: 21 U/L (ref 0–53)
AST: 20 U/L (ref 0–37)
Albumin: 4.3 g/dL (ref 3.5–5.2)
Alkaline Phosphatase: 49 U/L (ref 39–117)
Bilirubin, Direct: 0.1 mg/dL (ref 0.0–0.3)
Total Bilirubin: 0.7 mg/dL (ref 0.2–1.2)
Total Protein: 6.9 g/dL (ref 6.0–8.3)

## 2019-10-19 LAB — TESTOSTERONE TOTAL,FREE,BIO, MALES
Albumin: 4.2 g/dL (ref 3.6–5.1)
Sex Hormone Binding: 25 nmol/L (ref 22–77)
Testosterone, Bioavailable: 173.2 ng/dL (ref >=110.0)
Testosterone, Free: 89.9 pg/mL (ref 46.0–224.0)
Testosterone: 516 ng/dL (ref 250–827)

## 2019-10-19 LAB — PSA: PSA: 0.77 ng/mL (ref 0.10–4.00)

## 2019-10-19 MED ORDER — POTASSIUM CHLORIDE CRYS ER 20 MEQ PO TBCR: 20.0000 meq | EXTENDED_RELEASE_TABLET | Freq: Two times a day (BID) | ORAL | 1 refills | Status: DC

## 2019-10-20 ENCOUNTER — Other Ambulatory Visit: Payer: BC Managed Care – PPO

## 2019-10-20 LAB — CBC WITH DIFFERENTIAL/PLATELET
Basophils Absolute: 0 10*3/uL (ref 0.0–0.1)
Basophils Relative: 0.5 % (ref 0.0–3.0)
Eosinophils Absolute: 0.4 10*3/uL (ref 0.0–0.7)
Eosinophils Relative: 6.1 % — ABNORMAL HIGH (ref 0.0–5.0)
HCT: 45.5 % (ref 39.0–52.0)
Hemoglobin: 15.4 g/dL (ref 13.0–17.0)
Lymphocytes Relative: 23.2 % (ref 12.0–46.0)
Lymphs Abs: 1.6 10*3/uL (ref 0.7–4.0)
MCHC: 33.9 g/dL (ref 30.0–36.0)
MCV: 90.9 fl (ref 78.0–100.0)
Monocytes Absolute: 0.7 10*3/uL (ref 0.1–1.0)
Monocytes Relative: 10.1 % (ref 3.0–12.0)
Neutro Abs: 4.3 10*3/uL (ref 1.4–7.7)
Neutrophils Relative %: 60.1 % (ref 43.0–77.0)
Platelets: 255 10*3/uL (ref 150.0–400.0)
RBC: 5 Mil/uL (ref 4.22–5.81)
RDW: 13.8 % (ref 11.5–15.5)
WBC: 7.1 10*3/uL (ref 4.0–10.5)

## 2019-10-28 ENCOUNTER — Ambulatory Visit: Payer: BC Managed Care – PPO | Attending: Internal Medicine

## 2019-10-28 DIAGNOSIS — Z23 Encounter for immunization: Secondary | ICD-10-CM

## 2019-10-28 NOTE — Progress Notes (Signed)
   Covid-19 Vaccination Clinic  Name:  Lawrence Smith    MRN: [id number] DOB: Mar 27, 1961  10/28/2019  [name] was observed post Covid-19 immunization for 15 minutes without incident. He was provided with Vaccine Information Sheet and instruction to access the V-Safe system.   [name] was instructed to call 911 with any severe reactions post vaccine: Marland Kitchen Difficulty breathing  . Swelling of face and throat  . A fast heartbeat  . A bad rash all over body  . Dizziness and weakness   Immunizations Administered    Name Date Dose VIS Date Route   Pfizer COVID-19 Vaccine 10/28/2019  3:42 PM 0.3 mL 07/21/2018 Intramuscular   Manufacturer: [employer]   Lot: [id number]   [place]: [id number]

## 2019-11-22 ENCOUNTER — Ambulatory Visit: Payer: BC Managed Care – PPO | Attending: Internal Medicine

## 2019-11-22 DIAGNOSIS — Z23 Encounter for immunization: Secondary | ICD-10-CM

## 2019-11-22 NOTE — Progress Notes (Signed)
   Covid-19 Vaccination Clinic  Name:  Lawrence Smith    MRN: [id number] DOB: 1960-07-23  11/22/2019  [name] was observed post Covid-19 immunization for 15 minutes without incident. He was provided with Vaccine Information Sheet and instruction to access the V-Safe system.   [name] was instructed to call 911 with any severe reactions post vaccine: Marland Kitchen Difficulty breathing  . Swelling of face and throat  . A fast heartbeat  . A bad rash all over body  . Dizziness and weakness   Immunizations Administered    Name Date Dose VIS Date Route   Pfizer COVID-19 Vaccine 11/22/2019  3:49 PM 0.3 mL 07/21/2018 Intramuscular   Manufacturer: [place]   Lot: [id number]   [place]: [id number]

## 2019-12-08 ENCOUNTER — Encounter: Payer: Self-pay | Admitting: Internal Medicine

## 2019-12-10 ENCOUNTER — Other Ambulatory Visit: Payer: Self-pay

## 2019-12-10 DIAGNOSIS — I10 Essential (primary) hypertension: Secondary | ICD-10-CM

## 2019-12-10 DIAGNOSIS — T502X5A Adverse effect of carbonic-anhydrase inhibitors, benzothiadiazides and other diuretics, initial encounter: Secondary | ICD-10-CM

## 2019-12-10 DIAGNOSIS — E876 Hypokalemia: Secondary | ICD-10-CM

## 2019-12-10 MED ORDER — POTASSIUM CHLORIDE CRYS ER 20 MEQ PO TBCR: 20.0000 meq | EXTENDED_RELEASE_TABLET | Freq: Two times a day (BID) | ORAL | 1 refills | Status: DC

## 2019-12-10 NOTE — Progress Notes (Signed)
erx sent as requested.  

## 2020-01-19 ENCOUNTER — Other Ambulatory Visit: Payer: Self-pay

## 2020-01-19 ENCOUNTER — Ambulatory Visit: Payer: BC Managed Care – PPO | Admitting: Physician Assistant

## 2020-01-19 ENCOUNTER — Encounter: Payer: Self-pay | Admitting: Physician Assistant

## 2020-01-19 DIAGNOSIS — Z85828 Personal history of other malignant neoplasm of skin: Secondary | ICD-10-CM | POA: Diagnosis not present

## 2020-01-19 DIAGNOSIS — Z85820 Personal history of malignant melanoma of skin: Secondary | ICD-10-CM | POA: Diagnosis not present

## 2020-01-19 NOTE — Progress Notes (Signed)
   Follow up Visit  Subjective  Lawrence Smith is a 59 y.o. male who presents for the following: Follow-up (3 month follow up Right parietal scalp- no issues, healing good). No issues with healing.   Objective  Well appearing patient in no apparent distress; mood and affect are within normal limits.  A focused examination was performed including scalp. Relevant physical exam findings are noted in the Assessment and Plan.   Objective  Left Upper Arm - Anterior, Right Parietal Scalp: Surgical scar examined no sign of recurrence.   Assessment & Plan  History of basal cell carcinoma (BCC) (2) Left Upper Arm - Anterior; Right Parietal Scalp  Full skin check in February  Personal history of malignant melanoma of skin Right Upper Back

## 2020-01-20 ENCOUNTER — Ambulatory Visit: Payer: BC Managed Care – PPO | Admitting: Physician Assistant

## 2020-02-25 ENCOUNTER — Other Ambulatory Visit: Payer: Self-pay | Admitting: Internal Medicine

## 2020-02-25 ENCOUNTER — Encounter: Payer: Self-pay | Admitting: Internal Medicine

## 2020-02-25 DIAGNOSIS — E291 Testicular hypofunction: Secondary | ICD-10-CM

## 2020-02-26 ENCOUNTER — Other Ambulatory Visit: Payer: Self-pay | Admitting: Internal Medicine

## 2020-02-26 DIAGNOSIS — N522 Drug-induced erectile dysfunction: Secondary | ICD-10-CM

## 2020-02-26 DIAGNOSIS — E291 Testicular hypofunction: Secondary | ICD-10-CM

## 2020-02-26 MED ORDER — TESTOSTERONE 20.25 MG/ACT (1.62%) TD GEL: TRANSDERMAL | 1 refills | Status: DC

## 2020-02-26 MED ORDER — TADALAFIL 20 MG PO TABS: ORAL_TABLET | ORAL | 5 refills | Status: DC

## 2020-02-28 ENCOUNTER — Other Ambulatory Visit: Payer: Self-pay | Admitting: Internal Medicine

## 2020-03-16 ENCOUNTER — Ambulatory Visit: Payer: BC Managed Care – PPO | Admitting: Physician Assistant

## 2020-04-18 ENCOUNTER — Ambulatory Visit: Payer: BC Managed Care – PPO | Admitting: Internal Medicine

## 2020-04-18 ENCOUNTER — Encounter: Payer: Self-pay | Admitting: Internal Medicine

## 2020-04-18 ENCOUNTER — Other Ambulatory Visit: Payer: Self-pay

## 2020-04-18 VITALS — BP 136/84 | HR 67 | Temp 98.2°F | Ht 70.0 in | Wt 202.0 lb

## 2020-04-18 DIAGNOSIS — I10 Essential (primary) hypertension: Secondary | ICD-10-CM

## 2020-04-18 DIAGNOSIS — E876 Hypokalemia: Secondary | ICD-10-CM

## 2020-04-18 DIAGNOSIS — T502X5A Adverse effect of carbonic-anhydrase inhibitors, benzothiadiazides and other diuretics, initial encounter: Secondary | ICD-10-CM

## 2020-04-18 NOTE — Progress Notes (Signed)
Subjective:  Patient ID: Lawrence Smith, male    DOB: 08-Apr-1961  Age: 59 y.o. MRN: [id number]  CC: Hypertension  This visit occurred during the SARS-CoV-2 public health emergency.  Safety protocols were in place, including screening questions prior to the visit, additional usage of staff PPE, and extensive cleaning of exam room while observing appropriate contact time as indicated for disinfecting solutions.    HPI LUIS NICKLES presents for a BP check - He tells me his blood pressure has been well controlled.  He is active and denies any recent episodes of chest pain, shortness of breath, diaphoresis, edema, or fatigue.  He deferred on a flu vaccine today.  Outpatient Medications Prior to Visit  Medication Sig Dispense Refill   aspirin 81 MG chewable tablet Chew 81 mg by mouth daily.     indapamide (LOZOL) 2.5 MG tablet Take 1 tablet (2.5 mg total) by mouth daily. 90 tablet 1   potassium chloride SA (KLOR-CON) 20 MEQ tablet Take 1 tablet (20 mEq total) by mouth 2 (two) times daily. 180 tablet 1   tadalafil (CIALIS) 20 MG tablet TAKE 1 TABLET BY MOUTH DAILY AS NEEDED. GENERIC EQUIVALENT FOR CIALIS 12 tablet 5   Testosterone 20.25 MG/ACT (1.62%) GEL APPLY 2 PUMPS TOPICALLY DAILY 225 g 1   valACYclovir (VALTREX) 1000 MG tablet TAKE 1 TABLET BY MOUTH TWICE DAILY. GENERIC EQUIVALENT FOR VALTREX 20 tablet 2   valsartan (DIOVAN) 320 MG tablet TAKE 1 TABLET DAILY 90 tablet 1   No facility-administered medications prior to visit.    ROS Review of Systems  Constitutional: Negative for appetite change, chills, diaphoresis and fatigue.  HENT: Negative.   Eyes: Negative for visual disturbance.  Respiratory: Negative for cough, chest tightness, shortness of breath and wheezing.   Cardiovascular: Negative for chest pain, palpitations and leg swelling.  Gastrointestinal: Negative for abdominal pain, constipation, diarrhea, nausea and vomiting.  Endocrine: Negative.   Genitourinary:  Negative.  Negative for difficulty urinating.  Musculoskeletal: Negative.  Negative for arthralgias and myalgias.  Skin: Negative.  Negative for color change and pallor.  Neurological: Negative.  Negative for dizziness, weakness and light-headedness.  Hematological: Negative for adenopathy. Does not bruise/bleed easily.  Psychiatric/Behavioral: Negative.     Objective:  BP 136/84    Pulse 67    Temp 98.2 F (36.8 C) (Oral)    Ht 5\' 10"  (1.778 m)    Wt 202 lb (91.6 kg)    SpO2 97%    BMI 28.98 kg/m   BP Readings from Last 3 Encounters:  04/18/20 136/84  10/18/19 126/86  07/25/19 (!) 131/91    Wt Readings from Last 3 Encounters:  04/18/20 202 lb (91.6 kg)  10/18/19 201 lb (91.2 kg)  04/19/19 201 lb (91.2 kg)    Physical Exam Vitals reviewed.  Constitutional:      Appearance: Normal appearance.  HENT:     Nose: Nose normal.     Mouth/Throat:     Mouth: Mucous membranes are moist.  Eyes:     General: No scleral icterus.    Conjunctiva/sclera: Conjunctivae normal.  Cardiovascular:     Rate and Rhythm: Normal rate and regular rhythm.     Heart sounds: No murmur heard.   Pulmonary:     Effort: Pulmonary effort is normal.     Breath sounds: No stridor. No wheezing, rhonchi or rales.  Abdominal:     General: Abdomen is flat. Bowel sounds are normal. There is no distension.  Palpations: Abdomen is soft. There is no hepatomegaly, splenomegaly or mass.     Tenderness: There is no abdominal tenderness.  Musculoskeletal:        General: Normal range of motion.     Cervical back: Neck supple.     Right lower leg: No edema.     Left lower leg: No edema.  Lymphadenopathy:     Cervical: No cervical adenopathy.  Skin:    General: Skin is warm and dry.     Coloration: Skin is not pale.  Neurological:     General: No focal deficit present.     Mental Status: He is alert.  Psychiatric:        Mood and Affect: Mood normal.        Behavior: Behavior normal.     Lab  Results  Component Value Date   WBC 7.1 10/18/2019   HGB 15.4 10/18/2019   HCT 45.5 10/18/2019   PLT 255.0 10/18/2019   GLUCOSE 116 (H) 04/18/2020   CHOL 222 (H) 10/18/2019   TRIG 104.0 10/18/2019   HDL 48.50 10/18/2019   LDLCALC 153 (H) 10/18/2019   ALT 21 10/18/2019   AST 20 10/18/2019   NA 139 04/18/2020   K 3.7 04/18/2020   CL 101 04/18/2020   CREATININE 0.91 04/18/2020   BUN 18 04/18/2020   CO2 33 (H) 04/18/2020   TSH 1.68 05/11/2018   PSA 0.77 10/18/2019   HGBA1C 5.5 06/05/2012    No results found.  Assessment & Plan:   Dakwon was seen today for hypertension.  Diagnoses and all orders for this visit:  Essential hypertension- His blood pressure is adequately well controlled.  Electrolytes and renal function are normal.  Will continue the current dose of the ARB, thiazide, and potassium supplement. -     Basic metabolic panel; Future -     Magnesium; Future -     Magnesium -     Basic metabolic panel  Diuretic-induced hypokalemia -     Basic metabolic panel; Future -     Magnesium; Future -     Magnesium -     Basic metabolic panel   I am having [place] A. Heiler maintain his aspirin, valACYclovir, indapamide, potassium chloride SA, tadalafil, Testosterone, and valsartan.  No orders of the defined types were placed in this encounter.    Follow-up: Return in about 6 months (around 10/16/2020).  Scarlette Calico, MD

## 2020-04-18 NOTE — Patient Instructions (Signed)

## 2020-04-19 ENCOUNTER — Ambulatory Visit: Payer: BC Managed Care – PPO | Admitting: Internal Medicine

## 2020-04-19 LAB — BASIC METABOLIC PANEL
BUN: 18 mg/dL (ref 6–23)
CO2: 33 mEq/L — ABNORMAL HIGH (ref 19–32)
Calcium: 9.7 mg/dL (ref 8.4–10.5)
Chloride: 101 mEq/L (ref 96–112)
Creatinine, Ser: 0.91 mg/dL (ref 0.40–1.50)
GFR: 93.66 mL/min (ref >60.00)
Glucose, Bld: 116 mg/dL — ABNORMAL HIGH (ref 70–99)
Potassium: 3.7 mEq/L (ref 3.5–5.1)
Sodium: 139 mEq/L (ref 135–145)

## 2020-04-19 LAB — MAGNESIUM: Magnesium: 1.8 mg/dL (ref 1.5–2.5)

## 2020-04-25 ENCOUNTER — Encounter: Payer: Self-pay | Admitting: Internal Medicine

## 2020-05-29 ENCOUNTER — Encounter: Payer: Self-pay | Admitting: Internal Medicine

## 2020-05-29 ENCOUNTER — Other Ambulatory Visit: Payer: BC Managed Care – PPO

## 2020-05-29 DIAGNOSIS — Z20822 Contact with and (suspected) exposure to covid-19: Secondary | ICD-10-CM | POA: Diagnosis not present

## 2020-05-30 ENCOUNTER — Encounter: Payer: Self-pay | Admitting: Internal Medicine

## 2020-05-30 LAB — NOVEL CORONAVIRUS, NAA: SARS-CoV-2, NAA: DETECTED — AB

## 2020-05-30 LAB — SARS-COV-2, NAA 2 DAY TAT

## 2020-05-31 ENCOUNTER — Other Ambulatory Visit: Payer: Self-pay

## 2020-07-24 ENCOUNTER — Ambulatory Visit: Payer: BC Managed Care – PPO | Admitting: Dermatology

## 2020-07-24 ENCOUNTER — Encounter: Payer: Self-pay | Admitting: Dermatology

## 2020-07-24 ENCOUNTER — Ambulatory Visit: Payer: BC Managed Care – PPO | Admitting: Physician Assistant

## 2020-07-24 ENCOUNTER — Other Ambulatory Visit: Payer: Self-pay

## 2020-07-24 DIAGNOSIS — Z86018 Personal history of other benign neoplasm: Secondary | ICD-10-CM | POA: Diagnosis not present

## 2020-07-24 DIAGNOSIS — L918 Other hypertrophic disorders of the skin: Secondary | ICD-10-CM

## 2020-07-24 DIAGNOSIS — Z85820 Personal history of malignant melanoma of skin: Secondary | ICD-10-CM | POA: Diagnosis not present

## 2020-07-24 DIAGNOSIS — Z1283 Encounter for screening for malignant neoplasm of skin: Secondary | ICD-10-CM

## 2020-07-24 DIAGNOSIS — Z85828 Personal history of other malignant neoplasm of skin: Secondary | ICD-10-CM

## 2020-07-24 DIAGNOSIS — L821 Other seborrheic keratosis: Secondary | ICD-10-CM

## 2020-07-24 DIAGNOSIS — D485 Neoplasm of uncertain behavior of skin: Secondary | ICD-10-CM

## 2020-07-24 DIAGNOSIS — L820 Inflamed seborrheic keratosis: Secondary | ICD-10-CM

## 2020-07-24 DIAGNOSIS — D1800 Hemangioma unspecified site: Secondary | ICD-10-CM

## 2020-07-24 DIAGNOSIS — Z87898 Personal history of other specified conditions: Secondary | ICD-10-CM

## 2020-07-24 NOTE — Patient Instructions (Signed)

## 2020-07-24 NOTE — Progress Notes (Signed)
   Follow-Up Visit   Subjective  Lawrence Smith is a 60 y.o. male who presents for the following: Follow-up (Melanoma- right upper back x 2 years ago) and Annual Exam.  History of melanoma plus new growth on neck Location:  Duration:  Quality:  Associated Signs/Symptoms: Modifying Factors:  Severity:  Timing: Context: Needs general skin examination  Objective  Well appearing patient in no apparent distress; mood and affect are within normal limits. Objective  Mid Upper back: No sign repigmentation.  No regional adenopathy.  Objective  Left Lower Back: Complete skin examination.  No new or recurrent atypical pigmentation.  Objective  Left Upper Arm: No recurrence  Objective  Right Abdomen (side) - Upper: Full body skin examination  Objective  Right Lower Back: Brown textured 5 mm flattopped papule  Objective  Mid Back: Pedunculated 3 mm flesh-colored papule  Objective  Left Lower Leg - Posterior: Multiple red raised 2 mm smooth papules  Objective  Right Anterior Neck: Pearly pink 4 mm papule    A full examination was performed including scalp, head, eyes, ears, nose, lips, neck, chest, axillae, abdomen, back, buttocks, bilateral upper extremities, bilateral lower extremities, hands, feet, fingers, toes, fingernails, and toenails. All findings within normal limits unless otherwise noted below.   Assessment & Plan    Personal history of malignant melanoma of skin Mid Upper back  Yearly skin check, self examined twice annually.  Continued ultraviolet protection.  History of atypical nevus Left Lower Back  Yearly skin check  History of basal cell cancer Left Upper Arm  Recheck as needed change  Encounter for screening for malignant neoplasm of skin Right Abdomen (side) - Upper  Yearly skin check  Seborrheic keratosis Right Lower Back  Okay to leave if stable  Skin tag Mid Back  Okay to leave if stable  Hemangioma, unspecified  site Left Lower Leg - Posterior  Okay to leave if stable  Neoplasm of uncertain behavior of skin Right Anterior Neck  Skin / nail biopsy Type of biopsy: tangential   Informed consent: discussed and consent obtained   Timeout: patient name, date of birth, surgical site, and procedure verified   Procedure prep:  Patient was prepped and draped in usual sterile fashion (Non sterile) Prep type:  Chlorhexidine Anesthesia: the lesion was anesthetized in a standard fashion   Anesthetic:  1% lidocaine w/ epinephrine 1-100,000 local infiltration Instrument used: flexible razor blade   Outcome: patient tolerated procedure well   Post-procedure details: wound care instructions given    Specimen 1 - Surgical pathology Differential Diagnosis:r/o tag,sk  Check Margins: No      I, [name], MD, have reviewed all documentation for this visit.  The documentation on 08/03/20 for the exam, diagnosis, procedures, and orders are all accurate and complete.

## 2020-07-25 ENCOUNTER — Other Ambulatory Visit: Payer: Self-pay | Admitting: Internal Medicine

## 2020-07-25 ENCOUNTER — Encounter: Payer: Self-pay | Admitting: Internal Medicine

## 2020-07-25 DIAGNOSIS — B001 Herpesviral vesicular dermatitis: Secondary | ICD-10-CM

## 2020-07-25 MED ORDER — VALACYCLOVIR HCL 1 G PO TABS: 1000.0000 mg | ORAL_TABLET | Freq: Two times a day (BID) | ORAL | 2 refills | Status: DC

## 2020-07-28 ENCOUNTER — Other Ambulatory Visit: Payer: Self-pay | Admitting: Internal Medicine

## 2020-08-03 ENCOUNTER — Encounter: Payer: Self-pay | Admitting: Dermatology

## 2020-08-14 ENCOUNTER — Encounter: Payer: Self-pay | Admitting: Internal Medicine

## 2020-08-14 ENCOUNTER — Other Ambulatory Visit: Payer: Self-pay | Admitting: Internal Medicine

## 2020-08-14 DIAGNOSIS — N522 Drug-induced erectile dysfunction: Secondary | ICD-10-CM

## 2020-08-14 MED ORDER — TADALAFIL 20 MG PO TABS: ORAL_TABLET | ORAL | 5 refills | Status: DC

## 2020-09-18 ENCOUNTER — Encounter: Payer: Self-pay | Admitting: Internal Medicine

## 2020-09-26 ENCOUNTER — Other Ambulatory Visit: Payer: Self-pay | Admitting: Internal Medicine

## 2020-09-26 ENCOUNTER — Encounter: Payer: Self-pay | Admitting: Internal Medicine

## 2020-09-26 DIAGNOSIS — E291 Testicular hypofunction: Secondary | ICD-10-CM

## 2020-09-26 MED ORDER — TESTOSTERONE 20.25 MG/ACT (1.62%) TD GEL: TRANSDERMAL | 1 refills | Status: DC

## 2020-09-28 NOTE — Telephone Encounter (Signed)
Key: [id number]

## 2020-10-09 ENCOUNTER — Other Ambulatory Visit: Payer: Self-pay | Admitting: Internal Medicine

## 2020-10-09 DIAGNOSIS — E876 Hypokalemia: Secondary | ICD-10-CM

## 2020-10-09 DIAGNOSIS — I10 Essential (primary) hypertension: Secondary | ICD-10-CM

## 2020-10-16 DIAGNOSIS — H40033 Anatomical narrow angle, bilateral: Secondary | ICD-10-CM | POA: Diagnosis not present

## 2020-10-16 DIAGNOSIS — H2513 Age-related nuclear cataract, bilateral: Secondary | ICD-10-CM | POA: Diagnosis not present

## 2020-10-18 ENCOUNTER — Ambulatory Visit: Payer: BC Managed Care – PPO | Admitting: Internal Medicine

## 2020-10-19 ENCOUNTER — Other Ambulatory Visit: Payer: Self-pay

## 2020-10-19 ENCOUNTER — Encounter: Payer: Self-pay | Admitting: Internal Medicine

## 2020-10-19 ENCOUNTER — Ambulatory Visit: Payer: BC Managed Care – PPO | Admitting: Internal Medicine

## 2020-10-19 VITALS — BP 136/84 | HR 69 | Temp 98.4°F | Resp 16 | Ht 70.0 in | Wt 200.0 lb

## 2020-10-19 DIAGNOSIS — N138 Other obstructive and reflux uropathy: Secondary | ICD-10-CM

## 2020-10-19 DIAGNOSIS — I10 Essential (primary) hypertension: Secondary | ICD-10-CM

## 2020-10-19 DIAGNOSIS — N401 Enlarged prostate with lower urinary tract symptoms: Secondary | ICD-10-CM | POA: Diagnosis not present

## 2020-10-19 DIAGNOSIS — E291 Testicular hypofunction: Secondary | ICD-10-CM | POA: Diagnosis not present

## 2020-10-19 DIAGNOSIS — Z125 Encounter for screening for malignant neoplasm of prostate: Secondary | ICD-10-CM | POA: Diagnosis not present

## 2020-10-19 DIAGNOSIS — Z0000 Encounter for general adult medical examination without abnormal findings: Secondary | ICD-10-CM | POA: Diagnosis not present

## 2020-10-19 DIAGNOSIS — R739 Hyperglycemia, unspecified: Secondary | ICD-10-CM

## 2020-10-19 DIAGNOSIS — E785 Hyperlipidemia, unspecified: Secondary | ICD-10-CM

## 2020-10-19 DIAGNOSIS — E876 Hypokalemia: Secondary | ICD-10-CM | POA: Diagnosis not present

## 2020-10-19 DIAGNOSIS — T502X5A Adverse effect of carbonic-anhydrase inhibitors, benzothiadiazides and other diuretics, initial encounter: Secondary | ICD-10-CM

## 2020-10-19 DIAGNOSIS — R9431 Abnormal electrocardiogram [ECG] [EKG]: Secondary | ICD-10-CM

## 2020-10-19 LAB — CBC WITH DIFFERENTIAL/PLATELET
Basophils Absolute: 0 10*3/uL (ref 0.0–0.1)
Basophils Relative: 0.5 % (ref 0.0–3.0)
Eosinophils Absolute: 0.2 10*3/uL (ref 0.0–0.7)
Eosinophils Relative: 3.1 % (ref 0.0–5.0)
HCT: 48.3 % (ref 39.0–52.0)
Hemoglobin: 16.5 g/dL (ref 13.0–17.0)
Lymphocytes Relative: 23.9 % (ref 12.0–46.0)
Lymphs Abs: 1.8 10*3/uL (ref 0.7–4.0)
MCHC: 34.1 g/dL (ref 30.0–36.0)
MCV: 90.1 fl (ref 78.0–100.0)
Monocytes Absolute: 0.8 10*3/uL (ref 0.1–1.0)
Monocytes Relative: 10.5 % (ref 3.0–12.0)
Neutro Abs: 4.7 10*3/uL (ref 1.4–7.7)
Neutrophils Relative %: 62 % (ref 43.0–77.0)
Platelets: 261 10*3/uL (ref 150.0–400.0)
RBC: 5.36 Mil/uL (ref 4.22–5.81)
RDW: 13.5 % (ref 11.5–15.5)
WBC: 7.6 10*3/uL (ref 4.0–10.5)

## 2020-10-19 LAB — URINALYSIS, ROUTINE W REFLEX MICROSCOPIC
Bilirubin Urine: NEGATIVE
Hgb urine dipstick: NEGATIVE
Ketones, ur: NEGATIVE
Leukocytes,Ua: NEGATIVE
Nitrite: NEGATIVE
RBC / HPF: NONE SEEN (ref 0–?)
Specific Gravity, Urine: 1.02 (ref 1.000–1.030)
Total Protein, Urine: NEGATIVE
Urine Glucose: NEGATIVE
Urobilinogen, UA: 1 (ref 0.0–1.0)
pH: 7 (ref 5.0–8.0)

## 2020-10-19 LAB — HEPATIC FUNCTION PANEL
ALT: 23 U/L (ref 0–53)
AST: 16 U/L (ref 0–37)
Albumin: 4.4 g/dL (ref 3.5–5.2)
Alkaline Phosphatase: 51 U/L (ref 39–117)
Bilirubin, Direct: 0.1 mg/dL (ref 0.0–0.3)
Total Bilirubin: 0.8 mg/dL (ref 0.2–1.2)
Total Protein: 7.2 g/dL (ref 6.0–8.3)

## 2020-10-19 LAB — LIPID PANEL
Cholesterol: 232 mg/dL — ABNORMAL HIGH (ref 0–200)
HDL: 46.2 mg/dL (ref >39.00)
NonHDL: 185.66
Total CHOL/HDL Ratio: 5
Triglycerides: 259 mg/dL — ABNORMAL HIGH (ref 0.0–149.0)
VLDL: 51.8 mg/dL — ABNORMAL HIGH (ref 0.0–40.0)

## 2020-10-19 LAB — BASIC METABOLIC PANEL
BUN: 23 mg/dL (ref 6–23)
CO2: 33 mEq/L — ABNORMAL HIGH (ref 19–32)
Calcium: 10.1 mg/dL (ref 8.4–10.5)
Chloride: 101 mEq/L (ref 96–112)
Creatinine, Ser: 1.05 mg/dL (ref 0.40–1.50)
GFR: 78.6 mL/min (ref >60.00)
Glucose, Bld: 80 mg/dL (ref 70–99)
Potassium: 4.1 mEq/L (ref 3.5–5.1)
Sodium: 140 mEq/L (ref 135–145)

## 2020-10-19 LAB — LDL CHOLESTEROL, DIRECT: Direct LDL: 161 mg/dL

## 2020-10-19 LAB — PSA: PSA: 0.55 ng/mL (ref 0.10–4.00)

## 2020-10-19 LAB — HEMOGLOBIN A1C: Hgb A1c MFr Bld: 5.9 % (ref 4.6–6.5)

## 2020-10-19 LAB — TSH: TSH: 1.46 u[IU]/mL (ref 0.35–4.50)

## 2020-10-19 NOTE — Progress Notes (Signed)
Subjective:  Patient ID: Lawrence Smith, male    DOB: November 30, 1960  Age: 61 y.o. MRN: [id number]  CC: Annual Exam and Hypertension  This visit occurred during the SARS-CoV-2 public health emergency.  Safety protocols were in place, including screening questions prior to the visit, additional usage of staff PPE, and extensive cleaning of exam room while observing appropriate contact time as indicated for disinfecting solutions.    HPI Lawrence Smith presents for a CPX and f/up -  He is very active and denies any recent episodes of chest pain, shortness of breath, dizziness, lightheadedness, diaphoresis, or dyspnea on exertion.  Outpatient Medications Prior to Visit  Medication Sig Dispense Refill  . aspirin 81 MG chewable tablet Chew 81 mg by mouth daily.    . indapamide (LOZOL) 2.5 MG tablet Take 1 tablet (2.5 mg total) by mouth daily. 90 tablet 1  . potassium chloride SA (KLOR-CON) 20 MEQ tablet Take 1 tablet (20 mEq total) by mouth 2 (two) times daily. 180 tablet 1  . tadalafil (CIALIS) 20 MG tablet TAKE 1 TABLET BY MOUTH DAILY AS NEEDED. GENERIC EQUIVALENT FOR CIALIS 12 tablet 5  . Testosterone 20.25 MG/ACT (1.62%) GEL APPLY 2 PUMPS TOPICALLY DAILY 225 g 1  . valACYclovir (VALTREX) 1000 MG tablet Take 1 tablet (1,000 mg total) by mouth 2 (two) times daily. 20 tablet 2  . valsartan (DIOVAN) 320 MG tablet TAKE 1 TABLET DAILY 90 tablet 0   No facility-administered medications prior to visit.    ROS Review of Systems  Constitutional: Negative for diaphoresis and fatigue.  HENT: Negative.   Eyes: Negative for visual disturbance.  Respiratory: Negative for cough, chest tightness, shortness of breath and wheezing.   Cardiovascular: Negative for chest pain, palpitations and leg swelling.  Gastrointestinal: Negative for abdominal pain, constipation, diarrhea, nausea and vomiting.  Endocrine: Negative.   Genitourinary: Negative.  Negative for difficulty urinating, scrotal swelling and  testicular pain.  Musculoskeletal: Negative.  Negative for arthralgias and myalgias.  Skin: Negative.  Negative for color change and rash.  Neurological: Negative.  Negative for dizziness, weakness, light-headedness and headaches.  Hematological: Negative for adenopathy. Does not bruise/bleed easily.  Psychiatric/Behavioral: Negative.     Objective:  BP 136/84 (BP Location: Left Arm, Patient Position: Sitting, Cuff Size: Large)   Pulse 69   Temp 98.4 F (36.9 C) (Oral)   Resp 16   Ht 5\' 10"  (1.778 m)   Wt 200 lb (90.7 kg)   SpO2 98%   BMI 28.70 kg/m   BP Readings from Last 3 Encounters:  10/19/20 136/84  04/18/20 136/84  10/18/19 126/86    Wt Readings from Last 3 Encounters:  10/19/20 200 lb (90.7 kg)  04/18/20 202 lb (91.6 kg)  10/18/19 201 lb (91.2 kg)    Physical Exam Vitals reviewed.  HENT:     Nose: Nose normal.     Mouth/Throat:     Mouth: Mucous membranes are moist.  Eyes:     General: No scleral icterus.    Conjunctiva/sclera: Conjunctivae normal.  Cardiovascular:     Rate and Rhythm: Normal rate and regular rhythm.     Heart sounds: No murmur heard.   Pulmonary:     Effort: Pulmonary effort is normal.     Breath sounds: No stridor. No wheezing, rhonchi or rales.  Abdominal:     General: Abdomen is flat. Bowel sounds are normal. There is no distension.     Palpations: Abdomen is soft. There is no hepatomegaly,  splenomegaly or mass.     Tenderness: There is no abdominal tenderness.     Hernia: There is no hernia in the left inguinal area or right inguinal area.  Genitourinary:    Pubic Area: No rash.      Penis: Normal and circumcised.      Testes: Normal.     Epididymis:     Right: Normal.     Left: Normal.     Prostate: Enlarged. Not tender and no nodules present.     Rectum: Normal. Guaiac result negative. No mass, tenderness, anal fissure, external hemorrhoid or internal hemorrhoid. Normal anal tone.  Musculoskeletal:        General: Normal  range of motion.     Cervical back: Neck supple.     Right lower leg: No edema.     Left lower leg: No edema.  Lymphadenopathy:     Cervical: No cervical adenopathy.     Lower Body: No right inguinal adenopathy. No left inguinal adenopathy.  Skin:    General: Skin is warm and dry.     Coloration: Skin is not pale.  Neurological:     General: No focal deficit present.     Mental Status: He is alert and oriented to person, place, and time. Mental status is at baseline.  Psychiatric:        Mood and Affect: Mood normal.        Behavior: Behavior normal.     Lab Results  Component Value Date   WBC 7.6 10/19/2020   HGB 16.5 10/19/2020   HCT 48.3 10/19/2020   PLT 261.0 10/19/2020   GLUCOSE 80 10/19/2020   CHOL 232 (H) 10/19/2020   TRIG 259.0 (H) 10/19/2020   HDL 46.20 10/19/2020   LDLDIRECT 161.0 10/19/2020   LDLCALC 153 (H) 10/18/2019   ALT 23 10/19/2020   AST 16 10/19/2020   NA 140 10/19/2020   K 4.1 10/19/2020   CL 101 10/19/2020   CREATININE 1.05 10/19/2020   BUN 23 10/19/2020   CO2 33 (H) 10/19/2020   TSH 1.46 10/19/2020   PSA 0.55 10/19/2020   HGBA1C 5.9 10/19/2020    No results found.  Assessment & Plan:   Lawrence Smith was seen today for annual exam and hypertension.  Diagnoses and all orders for this visit:  Essential hypertension- His blood pressure is adequately well controlled. -     CBC with Differential/Platelet; Future -     Basic metabolic panel; Future -     TSH; Future -     Urinalysis, Routine w reflex microscopic; Future -     Hepatic function panel; Future -     Hepatic function panel -     Urinalysis, Routine w reflex microscopic -     TSH -     Basic metabolic panel -     CBC with Differential/Platelet  Hypogonadism in male- His testosterone level is in the normal range.  No complications noted. -     CBC with Differential/Platelet; Future -     Testosterone Total,Free,Bio, Males; Future -     Testosterone Total,Free,Bio, Males -     CBC  with Differential/Platelet  BPH with obstruction/lower urinary tract symptoms- He has no symptoms related to this and his PSA is low.  Diuretic-induced hypokalemia -     Basic metabolic panel; Future -     Basic metabolic panel  Hyperlipidemia LDL goal <130- His ASCVD risk score is less than 15% so I did not recommend a  statin for CV risk reduction. -     TSH; Future -     Hepatic function panel; Future -     Hepatic function panel -     TSH  Nonspecific abnormal electrocardiogram (ECG) (EKG)  Routine general medical examination at a health care facility- Exam completed, labs reviewed, vaccines reviewed, cancer screenings are up-to-date, patient education was given. -     Lipid panel; Future -     PSA; Future -     PSA -     Lipid panel  Chronic hyperglycemia- He has very mild prediabetes. -     Basic metabolic panel; Future -     Hemoglobin A1c; Future -     Hemoglobin A1c -     Basic metabolic panel  Other orders -     LDL cholesterol, direct   I am having Lawrence Smith maintain his aspirin, indapamide, potassium chloride SA, valACYclovir, valsartan, tadalafil, and Testosterone.  No orders of the defined types were placed in this encounter.    Follow-up: Return in about 6 months (around 04/21/2021).  Scarlette Calico, MD

## 2020-10-19 NOTE — Patient Instructions (Signed)

## 2020-10-20 ENCOUNTER — Encounter: Payer: Self-pay | Admitting: Internal Medicine

## 2020-10-20 LAB — TESTOSTERONE TOTAL,FREE,BIO, MALES
Albumin: 4.3 g/dL (ref 3.6–5.1)
Sex Hormone Binding: 27 nmol/L (ref 22–77)
Testosterone, Bioavailable: 88.7 ng/dL — ABNORMAL LOW (ref 110.0–575.0)
Testosterone, Free: 45 pg/mL — ABNORMAL LOW (ref 46.0–224.0)
Testosterone: 299 ng/dL (ref 250–827)

## 2020-10-24 ENCOUNTER — Encounter: Payer: Self-pay | Admitting: Internal Medicine

## 2020-10-24 ENCOUNTER — Other Ambulatory Visit: Payer: Self-pay | Admitting: Internal Medicine

## 2020-10-24 DIAGNOSIS — I10 Essential (primary) hypertension: Secondary | ICD-10-CM

## 2020-10-24 MED ORDER — INDAPAMIDE 2.5 MG PO TABS: 2.5000 mg | ORAL_TABLET | Freq: Every day | ORAL | 1 refills | Status: DC

## 2020-10-30 ENCOUNTER — Other Ambulatory Visit: Payer: Self-pay | Admitting: Internal Medicine

## 2020-11-24 ENCOUNTER — Encounter: Payer: Self-pay | Admitting: Internal Medicine

## 2020-11-24 ENCOUNTER — Other Ambulatory Visit: Payer: Self-pay | Admitting: Internal Medicine

## 2020-11-24 DIAGNOSIS — E876 Hypokalemia: Secondary | ICD-10-CM

## 2020-11-24 DIAGNOSIS — I10 Essential (primary) hypertension: Secondary | ICD-10-CM

## 2020-11-24 DIAGNOSIS — T502X5A Adverse effect of carbonic-anhydrase inhibitors, benzothiadiazides and other diuretics, initial encounter: Secondary | ICD-10-CM

## 2020-11-24 MED ORDER — POTASSIUM CHLORIDE CRYS ER 20 MEQ PO TBCR: 20.0000 meq | EXTENDED_RELEASE_TABLET | Freq: Two times a day (BID) | ORAL | 1 refills | Status: DC

## 2020-12-11 ENCOUNTER — Encounter: Payer: Self-pay | Admitting: Internal Medicine

## 2020-12-14 ENCOUNTER — Ambulatory Visit: Payer: BC Managed Care – PPO

## 2021-01-25 ENCOUNTER — Encounter: Payer: Self-pay | Admitting: Internal Medicine

## 2021-01-25 DIAGNOSIS — N522 Drug-induced erectile dysfunction: Secondary | ICD-10-CM

## 2021-01-25 MED ORDER — TADALAFIL 20 MG PO TABS: ORAL_TABLET | ORAL | 2 refills | Status: DC

## 2021-03-13 ENCOUNTER — Other Ambulatory Visit: Payer: Self-pay | Admitting: Internal Medicine

## 2021-03-13 DIAGNOSIS — I10 Essential (primary) hypertension: Secondary | ICD-10-CM

## 2021-04-16 ENCOUNTER — Encounter: Payer: Self-pay | Admitting: Internal Medicine

## 2021-04-17 NOTE — Telephone Encounter (Unsigned)
Good morning,   No worries. I rescheduled your appointment to Wednesday, 05/23/2021 at 3:40p.

## 2021-04-24 ENCOUNTER — Ambulatory Visit: Payer: BC Managed Care – PPO | Admitting: Internal Medicine

## 2021-04-24 ENCOUNTER — Encounter: Payer: Self-pay | Admitting: Internal Medicine

## 2021-04-25 ENCOUNTER — Other Ambulatory Visit: Payer: Self-pay | Admitting: Internal Medicine

## 2021-04-25 DIAGNOSIS — I10 Essential (primary) hypertension: Secondary | ICD-10-CM

## 2021-04-25 DIAGNOSIS — E876 Hypokalemia: Secondary | ICD-10-CM

## 2021-04-25 DIAGNOSIS — T502X5A Adverse effect of carbonic-anhydrase inhibitors, benzothiadiazides and other diuretics, initial encounter: Secondary | ICD-10-CM

## 2021-04-25 MED ORDER — POTASSIUM CHLORIDE CRYS ER 20 MEQ PO TBCR: 20.0000 meq | EXTENDED_RELEASE_TABLET | Freq: Two times a day (BID) | ORAL | 0 refills | Status: DC

## 2021-04-30 ENCOUNTER — Other Ambulatory Visit: Payer: Self-pay | Admitting: Internal Medicine

## 2021-05-02 ENCOUNTER — Encounter: Payer: Self-pay | Admitting: Internal Medicine

## 2021-05-02 ENCOUNTER — Other Ambulatory Visit: Payer: Self-pay | Admitting: Internal Medicine

## 2021-05-02 DIAGNOSIS — E291 Testicular hypofunction: Secondary | ICD-10-CM

## 2021-05-02 MED ORDER — TESTOSTERONE 20.25 MG/ACT (1.62%) TD GEL: TRANSDERMAL | 0 refills | Status: DC

## 2021-05-23 ENCOUNTER — Ambulatory Visit: Payer: BC Managed Care – PPO | Admitting: Internal Medicine

## 2021-05-23 ENCOUNTER — Other Ambulatory Visit: Payer: Self-pay

## 2021-05-23 ENCOUNTER — Encounter: Payer: Self-pay | Admitting: Internal Medicine

## 2021-05-23 VITALS — BP 148/98 | HR 87 | Temp 98.3°F | Ht 70.0 in | Wt 206.0 lb

## 2021-05-23 DIAGNOSIS — I10 Essential (primary) hypertension: Secondary | ICD-10-CM | POA: Diagnosis not present

## 2021-05-23 DIAGNOSIS — T502X5A Adverse effect of carbonic-anhydrase inhibitors, benzothiadiazides and other diuretics, initial encounter: Secondary | ICD-10-CM | POA: Diagnosis not present

## 2021-05-23 DIAGNOSIS — E291 Testicular hypofunction: Secondary | ICD-10-CM | POA: Diagnosis not present

## 2021-05-23 DIAGNOSIS — E876 Hypokalemia: Secondary | ICD-10-CM

## 2021-05-23 LAB — CBC WITH DIFFERENTIAL/PLATELET
Basophils Absolute: 0 10*3/uL (ref 0.0–0.1)
Basophils Relative: 0.3 % (ref 0.0–3.0)
Eosinophils Absolute: 0.2 10*3/uL (ref 0.0–0.7)
Eosinophils Relative: 3.3 % (ref 0.0–5.0)
HCT: 45.7 % (ref 39.0–52.0)
Hemoglobin: 15.2 g/dL (ref 13.0–17.0)
Lymphocytes Relative: 24.5 % (ref 12.0–46.0)
Lymphs Abs: 1.7 10*3/uL (ref 0.7–4.0)
MCHC: 33.4 g/dL (ref 30.0–36.0)
MCV: 90.2 fl (ref 78.0–100.0)
Monocytes Absolute: 0.6 10*3/uL (ref 0.1–1.0)
Monocytes Relative: 9.5 % (ref 3.0–12.0)
Neutro Abs: 4.3 10*3/uL (ref 1.4–7.7)
Neutrophils Relative %: 62.4 % (ref 43.0–77.0)
Platelets: 282 10*3/uL (ref 150.0–400.0)
RBC: 5.07 Mil/uL (ref 4.22–5.81)
RDW: 13.5 % (ref 11.5–15.5)
WBC: 6.9 10*3/uL (ref 4.0–10.5)

## 2021-05-23 LAB — BASIC METABOLIC PANEL
BUN: 20 mg/dL (ref 6–23)
CO2: 30 mEq/L (ref 19–32)
Calcium: 9.7 mg/dL (ref 8.4–10.5)
Chloride: 100 mEq/L (ref 96–112)
Creatinine, Ser: 0.88 mg/dL (ref 0.40–1.50)
GFR: 94.82 mL/min (ref >60.00)
Glucose, Bld: 86 mg/dL (ref 70–99)
Potassium: 4.2 mEq/L (ref 3.5–5.1)
Sodium: 138 mEq/L (ref 135–145)

## 2021-05-23 NOTE — Progress Notes (Signed)
Subjective:  Patient ID: Lawrence Smith, male    DOB: 06-Mar-1961  Age: 60 y.o. MRN: [id number]  CC: Hypertension  This visit occurred during the SARS-CoV-2 public health emergency.  Safety protocols were in place, including screening questions prior to the visit, additional usage of staff PPE, and extensive cleaning of exam room while observing appropriate contact time as indicated for disinfecting solutions.    HPI Lawrence Smith presents for f/up -  He is active but has not been exercising recently.  When he is active he does not experience chest pain, shortness of breath, diaphoresis, or edema.  He complains of weight gain.  He has not been monitoring his blood pressure. He was not willing to get a flu vaccine today.  Outpatient Medications Prior to Visit  Medication Sig Dispense Refill   aspirin 81 MG chewable tablet Chew 81 mg by mouth daily.     indapamide (LOZOL) 2.5 MG tablet TAKE 1 TABLET DAILY 90 tablet 0   potassium chloride SA (KLOR-CON M) 20 MEQ tablet Take 1 tablet (20 mEq total) by mouth 2 (two) times daily. 180 tablet 0   tadalafil (CIALIS) 20 MG tablet TAKE 1 TABLET BY MOUTH DAILY AS NEEDED. GENERIC EQUIVALENT FOR CIALIS 12 tablet 2   Testosterone 20.25 MG/ACT (1.62%) GEL APPLY 2 PUMPS TOPICALLY DAILY 225 g 0   valACYclovir (VALTREX) 1000 MG tablet Take 1 tablet (1,000 mg total) by mouth 2 (two) times daily. 20 tablet 2   valsartan (DIOVAN) 320 MG tablet TAKE 1 TABLET DAILY 90 tablet 0   No facility-administered medications prior to visit.    ROS Review of Systems  Constitutional:  Positive for unexpected weight change (wt gain). Negative for diaphoresis and fatigue.  HENT: Negative.    Eyes: Negative.   Respiratory:  Negative for cough, chest tightness, shortness of breath and wheezing.   Cardiovascular:  Negative for chest pain, palpitations and leg swelling.  Gastrointestinal:  Negative for abdominal pain and constipation.  Endocrine: Negative.   Genitourinary:  Negative.  Negative for difficulty urinating, dysuria and hematuria.  Musculoskeletal: Negative.   Skin: Negative.   Neurological:  Negative for dizziness, weakness, light-headedness and headaches.  Hematological:  Negative for adenopathy. Does not bruise/bleed easily.  Psychiatric/Behavioral: Negative.     Objective:  BP (!) 148/98 (BP Location: Left Arm, Patient Position: Sitting, Cuff Size: Large)    Pulse 87    Temp 98.3 F (36.8 C) (Oral)    Ht 5\' 10"  (1.778 m)    Wt 206 lb (93.4 kg)    SpO2 97%    BMI 29.56 kg/m   BP Readings from Last 3 Encounters:  05/23/21 (!) 148/98  10/19/20 136/84  04/18/20 136/84    Wt Readings from Last 3 Encounters:  05/23/21 206 lb (93.4 kg)  10/19/20 200 lb (90.7 kg)  04/18/20 202 lb (91.6 kg)    Physical Exam Vitals reviewed.  HENT:     Nose: Nose normal.     Mouth/Throat:     Mouth: Mucous membranes are moist.  Eyes:     General: No scleral icterus.    Conjunctiva/sclera: Conjunctivae normal.  Cardiovascular:     Rate and Rhythm: Normal rate and regular rhythm.     Heart sounds: No murmur heard. Pulmonary:     Effort: Pulmonary effort is normal.     Breath sounds: No stridor. No wheezing, rhonchi or rales.  Abdominal:     General: Abdomen is flat.     Palpations:  There is no mass.     Tenderness: There is no abdominal tenderness. There is no guarding.     Hernia: No hernia is present.  Musculoskeletal:        General: Normal range of motion.     Cervical back: Neck supple.  Lymphadenopathy:     Cervical: No cervical adenopathy.  Skin:    General: Skin is warm.     Findings: No rash.  Neurological:     General: No focal deficit present.     Mental Status: He is alert.  Psychiatric:        Mood and Affect: Mood normal.        Behavior: Behavior normal.    Lab Results  Component Value Date   WBC 6.9 05/23/2021   HGB 15.2 05/23/2021   HCT 45.7 05/23/2021   PLT 282.0 05/23/2021   GLUCOSE 86 05/23/2021   CHOL 232 (H)  10/19/2020   TRIG 259.0 (H) 10/19/2020   HDL 46.20 10/19/2020   LDLDIRECT 161.0 10/19/2020   LDLCALC 153 (H) 10/18/2019   ALT 23 10/19/2020   AST 16 10/19/2020   NA 138 05/23/2021   K 4.2 05/23/2021   CL 100 05/23/2021   CREATININE 0.88 05/23/2021   BUN 20 05/23/2021   CO2 30 05/23/2021   TSH 1.46 10/19/2020   PSA 0.55 10/19/2020   HGBA1C 5.9 10/19/2020    No results found.  Assessment & Plan:   Radek was seen today for hypertension.  Diagnoses and all orders for this visit:  Essential hypertension- His blood pressure is not adequately well controlled but he is not willing to add another agent.  He will improve his lifestyle modifications and will return in 3 months for a blood pressure recheck. -     Basic metabolic panel; Future -     CBC with Differential/Platelet; Future -     CBC with Differential/Platelet -     Basic metabolic panel  Hypogonadism in male- His testosterone level is normal.  Will continue the current dose. -     CBC with Differential/Platelet; Future -     Testosterone Total,Free,Bio, Males; Future -     Testosterone Total,Free,Bio, Males -     CBC with Differential/Platelet  Diuretic-induced hypokalemia- His potassium level is normal. -     Basic metabolic panel; Future -     Basic metabolic panel   I am having [place] A. Salatino maintain his aspirin, valACYclovir, tadalafil, indapamide, potassium chloride SA, valsartan, and Testosterone.  No orders of the defined types were placed in this encounter.    Follow-up: Return in about 3 months (around 08/21/2021).  Scarlette Calico, MD

## 2021-05-23 NOTE — Patient Instructions (Signed)

## 2021-05-24 LAB — TESTOSTERONE TOTAL,FREE,BIO, MALES
Albumin: 4.1 g/dL (ref 3.6–5.1)
Sex Hormone Binding: 29 nmol/L (ref 22–77)
Testosterone, Bioavailable: 100.3 ng/dL — ABNORMAL LOW (ref 110.0–575.0)
Testosterone, Free: 53.3 pg/mL (ref 46.0–224.0)
Testosterone: 361 ng/dL (ref 250–827)

## 2021-07-02 ENCOUNTER — Encounter: Payer: Self-pay | Admitting: Internal Medicine

## 2021-07-17 ENCOUNTER — Other Ambulatory Visit: Payer: Self-pay | Admitting: Internal Medicine

## 2021-07-17 DIAGNOSIS — I10 Essential (primary) hypertension: Secondary | ICD-10-CM

## 2021-07-18 ENCOUNTER — Other Ambulatory Visit: Payer: Self-pay | Admitting: Internal Medicine

## 2021-07-18 DIAGNOSIS — I10 Essential (primary) hypertension: Secondary | ICD-10-CM

## 2021-07-18 DIAGNOSIS — E876 Hypokalemia: Secondary | ICD-10-CM

## 2021-07-24 ENCOUNTER — Other Ambulatory Visit: Payer: Self-pay

## 2021-07-24 ENCOUNTER — Ambulatory Visit: Payer: BC Managed Care – PPO | Admitting: Dermatology

## 2021-07-24 DIAGNOSIS — Z85820 Personal history of malignant melanoma of skin: Secondary | ICD-10-CM | POA: Diagnosis not present

## 2021-07-24 DIAGNOSIS — L821 Other seborrheic keratosis: Secondary | ICD-10-CM | POA: Diagnosis not present

## 2021-07-24 DIAGNOSIS — Z1283 Encounter for screening for malignant neoplasm of skin: Secondary | ICD-10-CM

## 2021-07-24 DIAGNOSIS — C44519 Basal cell carcinoma of skin of other part of trunk: Secondary | ICD-10-CM

## 2021-07-24 DIAGNOSIS — L918 Other hypertrophic disorders of the skin: Secondary | ICD-10-CM | POA: Diagnosis not present

## 2021-07-24 DIAGNOSIS — D485 Neoplasm of uncertain behavior of skin: Secondary | ICD-10-CM

## 2021-07-24 NOTE — Patient Instructions (Signed)

## 2021-07-30 ENCOUNTER — Other Ambulatory Visit: Payer: Self-pay | Admitting: Internal Medicine

## 2021-08-03 ENCOUNTER — Encounter: Payer: Self-pay | Admitting: Dermatology

## 2021-08-03 NOTE — Progress Notes (Signed)
° °  Follow-Up Visit   Subjective  Lawrence Smith is a 61 y.o. male who presents for the following: Annual Exam (Here for annual 6 month check up. No concerns. History of melanoma in situ. ).  General skin check, history of melanoma, some new spots Location:  Duration:  Quality:  Associated Signs/Symptoms: Modifying Factors:  Severity:  Timing: Context:   Objective  Well appearing patient in no apparent distress; mood and affect are within normal limits. Mid Back Yearly skin exam.  Left Temple, Mid Back, Neck - Anterior Multiple brown textured 4 to 8 mm papules, typical dermoscopy  Left Axilla, Right Axilla Fleshy, skin-colored  pedunculated papules.    Mid Back No sign of repigmentation, no regional adenopathy  Right Lower Back Waxy pink flat 1 cm lesion, superficial BCC       A full examination was performed including scalp, head, eyes, ears, nose, lips, neck, chest, axillae, abdomen, back, buttocks, bilateral upper extremities, bilateral lower extremities, hands, feet, fingers, toes, fingernails, and toenails. All findings within normal limits unless otherwise noted below.   Assessment & Plan    Screening for malignant neoplasm of skin Mid Back  Follow up in 6 months.  Seborrheic keratosis (3) Neck - Anterior; Mid Back; Left Temple  Leave if stable.  Skin tag (2) Left Axilla; Right Axilla  Issues to excise in future.  Personal history of malignant melanoma of skin Mid Back  Annual skin examination  Basal cell carcinoma (BCC) of back Right Lower Back  Skin / nail biopsy Type of biopsy: tangential   Informed consent: discussed and consent obtained   Timeout: patient name, date of birth, surgical site, and procedure verified   Anesthesia: the lesion was anesthetized in a standard fashion   Anesthetic:  1% lidocaine w/ epinephrine 1-100,000 local infiltration Instrument used: flexible razor blade   Hemostasis achieved with: ferric subsulfate    Outcome: patient tolerated procedure well   Post-procedure details: wound care instructions given    Destruction of lesion Complexity: simple   Destruction method: electrodesiccation and curettage   Informed consent: discussed and consent obtained   Timeout:  patient name, date of birth, surgical site, and procedure verified Anesthesia: the lesion was anesthetized in a standard fashion   Anesthetic:  1% lidocaine w/ epinephrine 1-100,000 local infiltration Curettage performed in three different directions: Yes   Curettage cycles:  3 Lesion length (cm):  1 Lesion width (cm):  1 Margin per side (cm):  0 Final wound size (cm):  1 Hemostasis achieved with:  aluminum chloride Outcome: patient tolerated procedure well with no complications   Post-procedure details: wound care instructions given    Specimen 1 - Surgical pathology Differential Diagnosis: scc vs bcc  Check Margins: No  After shave biopsy the base was treated with curettage plus cautery      I, Lawrence Monarch, MD, have reviewed all documentation for this visit.  The documentation on 08/03/21 for the exam, diagnosis, procedures, and orders are all accurate and complete.

## 2021-08-06 ENCOUNTER — Other Ambulatory Visit: Payer: Self-pay | Admitting: Internal Medicine

## 2021-08-06 DIAGNOSIS — E291 Testicular hypofunction: Secondary | ICD-10-CM

## 2021-08-23 ENCOUNTER — Ambulatory Visit: Payer: BC Managed Care – PPO | Admitting: Internal Medicine

## 2021-08-23 ENCOUNTER — Encounter: Payer: Self-pay | Admitting: Internal Medicine

## 2021-08-23 VITALS — BP 136/84 | HR 88 | Temp 98.1°F | Ht 70.0 in | Wt 204.0 lb

## 2021-08-23 DIAGNOSIS — I10 Essential (primary) hypertension: Secondary | ICD-10-CM

## 2021-08-23 DIAGNOSIS — Z23 Encounter for immunization: Secondary | ICD-10-CM

## 2021-08-23 NOTE — Patient Instructions (Signed)

## 2021-08-23 NOTE — Progress Notes (Signed)
? ?Subjective:  ?Patient ID: Lawrence Smith, male    DOB: 04/28/1961  Age: 61 y.o. MRN: [id number] ? ?CC: Hypertension ? ?This visit occurred during the SARS-CoV-2 public health emergency.  Safety protocols were in place, including screening questions prior to the visit, additional usage of staff PPE, and extensive cleaning of exam room while observing appropriate contact time as indicated for disinfecting solutions.   ? ?HPI ?Lawrence Smith presents for f/up - ? ?He tells me his blood pressure is well controlled.  He is active and denies chest pain, shortness of breath, diaphoresis, edema, dizziness, lightheadedness, or fatigue. ? ?Outpatient Medications Prior to Visit  ?Medication Sig Dispense Refill  ? aspirin 81 MG chewable tablet Chew 81 mg by mouth daily.    ? indapamide (LOZOL) 2.5 MG tablet TAKE 1 TABLET DAILY 90 tablet 0  ? KLOR-CON M20 20 MEQ tablet TAKE 1 TABLET TWICE A DAY 180 tablet 0  ? tadalafil (CIALIS) 20 MG tablet TAKE 1 TABLET BY MOUTH DAILY AS NEEDED. GENERIC EQUIVALENT FOR CIALIS 12 tablet 2  ? Testosterone 20.25 MG/ACT (1.62%) GEL APPLY 2 PUMPS TOPICALLY DAILY 225 g 0  ? valACYclovir (VALTREX) 1000 MG tablet Take 1 tablet (1,000 mg total) by mouth 2 (two) times daily. 20 tablet 2  ? valsartan (DIOVAN) 320 MG tablet TAKE 1 TABLET DAILY 90 tablet 0  ? ?No facility-administered medications prior to visit.  ? ? ?ROS ?Review of Systems  ?Constitutional:  Negative for diaphoresis and fatigue.  ?HENT: Negative.    ?Eyes: Negative.   ?Respiratory:  Negative for chest tightness, shortness of breath and wheezing.   ?Cardiovascular:  Negative for chest pain, palpitations and leg swelling.  ?Gastrointestinal:  Negative for abdominal pain.  ?Endocrine: Negative.   ?Genitourinary: Negative.  Negative for difficulty urinating and dysuria.  ?Musculoskeletal: Negative.  Negative for myalgias.  ?Skin: Negative.   ?Neurological:  Negative for dizziness, weakness, light-headedness and numbness.  ?Hematological:   Negative for adenopathy. Does not bruise/bleed easily.  ?Psychiatric/Behavioral: Negative.    ? ?Objective:  ?BP 136/84 (BP Location: Left Arm, Patient Position: Sitting, Cuff Size: Large)   Pulse 88   Temp 98.1 ?F (36.7 ?C) (Oral)   Ht '5\' 10"'$  (1.778 m)   Wt 204 lb (92.5 kg)   SpO2 96%   BMI 29.27 kg/m?  ? ?BP Readings from Last 3 Encounters:  ?08/23/21 136/84  ?05/23/21 (!) 148/98  ?10/19/20 136/84  ? ? ?Wt Readings from Last 3 Encounters:  ?08/23/21 204 lb (92.5 kg)  ?05/23/21 206 lb (93.4 kg)  ?10/19/20 200 lb (90.7 kg)  ? ? ?Physical Exam ?Vitals reviewed.  ?HENT:  ?   Nose: Nose normal.  ?   Mouth/Throat:  ?   Mouth: Mucous membranes are moist.  ?Eyes:  ?   General: No scleral icterus. ?   Conjunctiva/sclera: Conjunctivae normal.  ?Cardiovascular:  ?   Rate and Rhythm: Normal rate and regular rhythm.  ?   Heart sounds: No murmur heard. ?Pulmonary:  ?   Effort: Pulmonary effort is normal.  ?   Breath sounds: No stridor. No wheezing, rhonchi or rales.  ?Abdominal:  ?   General: Abdomen is flat.  ?   Palpations: There is no mass.  ?   Tenderness: There is no abdominal tenderness. There is no guarding.  ?   Hernia: No hernia is present.  ?Musculoskeletal:     ?   General: Normal range of motion.  ?   Cervical back: Neck supple.  ?  Right lower leg: No edema.  ?   Left lower leg: No edema.  ?Lymphadenopathy:  ?   Cervical: No cervical adenopathy.  ?Skin: ?   General: Skin is warm and dry.  ?Neurological:  ?   General: No focal deficit present.  ?   Mental Status: He is alert.  ? ? ?Lab Results  ?Component Value Date  ? WBC 6.9 05/23/2021  ? HGB 15.2 05/23/2021  ? HCT 45.7 05/23/2021  ? PLT 282.0 05/23/2021  ? GLUCOSE 86 05/23/2021  ? CHOL 232 (H) 10/19/2020  ? TRIG 259.0 (H) 10/19/2020  ? HDL 46.20 10/19/2020  ? LDLDIRECT 161.0 10/19/2020  ? LDLCALC 153 (H) 10/18/2019  ? ALT 23 10/19/2020  ? AST 16 10/19/2020  ? NA 138 05/23/2021  ? K 4.2 05/23/2021  ? CL 100 05/23/2021  ? CREATININE 0.88 05/23/2021  ? BUN 20  05/23/2021  ? CO2 30 05/23/2021  ? TSH 1.46 10/19/2020  ? PSA 0.55 10/19/2020  ? HGBA1C 5.9 10/19/2020  ? ? ?No results found. ? ?Assessment & Plan:  ? ?Harris was seen today for hypertension. ? ?Diagnoses and all orders for this visit: ? ?Essential hypertension- His blood pressure is well controlled.  Will continue the current antihypertensives. ? ?Other orders ?-     Varicella-zoster vaccine IM (Shingrix) ? ? ?I am having Xaivier A. Lazare maintain his aspirin, valACYclovir, tadalafil, indapamide, Klor-Con M20, valsartan, and Testosterone. ? ?No orders of the defined types were placed in this encounter. ? ? ? ?Follow-up: Return in about 4 months (around 12/23/2021). ? ?Scarlette Calico, MD ?

## 2021-09-08 ENCOUNTER — Encounter: Payer: Self-pay | Admitting: Internal Medicine

## 2021-09-10 ENCOUNTER — Other Ambulatory Visit: Payer: Self-pay | Admitting: Internal Medicine

## 2021-10-01 ENCOUNTER — Other Ambulatory Visit: Payer: Self-pay | Admitting: Internal Medicine

## 2021-10-01 DIAGNOSIS — I10 Essential (primary) hypertension: Secondary | ICD-10-CM

## 2021-10-01 DIAGNOSIS — E876 Hypokalemia: Secondary | ICD-10-CM

## 2021-10-15 ENCOUNTER — Other Ambulatory Visit: Payer: Self-pay | Admitting: Internal Medicine

## 2021-10-15 DIAGNOSIS — N522 Drug-induced erectile dysfunction: Secondary | ICD-10-CM

## 2021-10-15 DIAGNOSIS — I10 Essential (primary) hypertension: Secondary | ICD-10-CM

## 2021-10-29 ENCOUNTER — Other Ambulatory Visit: Payer: Self-pay | Admitting: Internal Medicine

## 2021-11-23 ENCOUNTER — Encounter: Payer: Self-pay | Admitting: Internal Medicine

## 2021-11-29 ENCOUNTER — Ambulatory Visit: Payer: BC Managed Care – PPO

## 2021-12-05 ENCOUNTER — Other Ambulatory Visit: Payer: Self-pay | Admitting: Internal Medicine

## 2021-12-05 DIAGNOSIS — E291 Testicular hypofunction: Secondary | ICD-10-CM

## 2021-12-05 DIAGNOSIS — B001 Herpesviral vesicular dermatitis: Secondary | ICD-10-CM

## 2021-12-12 DIAGNOSIS — H2513 Age-related nuclear cataract, bilateral: Secondary | ICD-10-CM | POA: Diagnosis not present

## 2021-12-12 DIAGNOSIS — H40033 Anatomical narrow angle, bilateral: Secondary | ICD-10-CM | POA: Diagnosis not present

## 2021-12-17 ENCOUNTER — Ambulatory Visit: Payer: BC Managed Care – PPO

## 2022-01-01 ENCOUNTER — Encounter: Payer: Self-pay | Admitting: Internal Medicine

## 2022-01-08 ENCOUNTER — Encounter: Payer: Self-pay | Admitting: Internal Medicine

## 2022-01-14 ENCOUNTER — Other Ambulatory Visit: Payer: Self-pay | Admitting: Internal Medicine

## 2022-01-14 DIAGNOSIS — I10 Essential (primary) hypertension: Secondary | ICD-10-CM

## 2022-01-28 ENCOUNTER — Other Ambulatory Visit: Payer: Self-pay | Admitting: Internal Medicine

## 2022-03-14 ENCOUNTER — Encounter: Payer: Self-pay | Admitting: Internal Medicine

## 2022-03-14 ENCOUNTER — Ambulatory Visit: Payer: BC Managed Care – PPO | Admitting: Internal Medicine

## 2022-03-14 VITALS — BP 134/88 | HR 70 | Temp 98.0°F | Ht 70.0 in | Wt 203.0 lb

## 2022-03-14 DIAGNOSIS — Z23 Encounter for immunization: Secondary | ICD-10-CM | POA: Diagnosis not present

## 2022-03-14 DIAGNOSIS — Z125 Encounter for screening for malignant neoplasm of prostate: Secondary | ICD-10-CM

## 2022-03-14 DIAGNOSIS — T502X5A Adverse effect of carbonic-anhydrase inhibitors, benzothiadiazides and other diuretics, initial encounter: Secondary | ICD-10-CM | POA: Diagnosis not present

## 2022-03-14 DIAGNOSIS — N522 Drug-induced erectile dysfunction: Secondary | ICD-10-CM | POA: Diagnosis not present

## 2022-03-14 DIAGNOSIS — N401 Enlarged prostate with lower urinary tract symptoms: Secondary | ICD-10-CM

## 2022-03-14 DIAGNOSIS — I10 Essential (primary) hypertension: Secondary | ICD-10-CM

## 2022-03-14 DIAGNOSIS — E291 Testicular hypofunction: Secondary | ICD-10-CM

## 2022-03-14 DIAGNOSIS — Z0000 Encounter for general adult medical examination without abnormal findings: Secondary | ICD-10-CM

## 2022-03-14 DIAGNOSIS — N138 Other obstructive and reflux uropathy: Secondary | ICD-10-CM

## 2022-03-14 DIAGNOSIS — E785 Hyperlipidemia, unspecified: Secondary | ICD-10-CM | POA: Diagnosis not present

## 2022-03-14 DIAGNOSIS — R739 Hyperglycemia, unspecified: Secondary | ICD-10-CM | POA: Diagnosis not present

## 2022-03-14 DIAGNOSIS — E876 Hypokalemia: Secondary | ICD-10-CM

## 2022-03-14 LAB — CBC WITH DIFFERENTIAL/PLATELET
Basophils Absolute: 0 10*3/uL (ref 0.0–0.1)
Basophils Relative: 0.4 % (ref 0.0–3.0)
Eosinophils Absolute: 0.3 10*3/uL (ref 0.0–0.7)
Eosinophils Relative: 3.5 % (ref 0.0–5.0)
HCT: 47.4 % (ref 39.0–52.0)
Hemoglobin: 15.8 g/dL (ref 13.0–17.0)
Lymphocytes Relative: 19.9 % (ref 12.0–46.0)
Lymphs Abs: 1.6 10*3/uL (ref 0.7–4.0)
MCHC: 33.3 g/dL (ref 30.0–36.0)
MCV: 90.4 fl (ref 78.0–100.0)
Monocytes Absolute: 0.8 10*3/uL (ref 0.1–1.0)
Monocytes Relative: 9.7 % (ref 3.0–12.0)
Neutro Abs: 5.3 10*3/uL (ref 1.4–7.7)
Neutrophils Relative %: 66.5 % (ref 43.0–77.0)
Platelets: 268 10*3/uL (ref 150.0–400.0)
RBC: 5.25 Mil/uL (ref 4.22–5.81)
RDW: 13.6 % (ref 11.5–15.5)
WBC: 7.9 10*3/uL (ref 4.0–10.5)

## 2022-03-14 LAB — BASIC METABOLIC PANEL
BUN: 22 mg/dL (ref 6–23)
CO2: 34 mEq/L — ABNORMAL HIGH (ref 19–32)
Calcium: 10 mg/dL (ref 8.4–10.5)
Chloride: 100 mEq/L (ref 96–112)
Creatinine, Ser: 0.86 mg/dL (ref 0.40–1.50)
GFR: 94.94 mL/min (ref >60.00)
Glucose, Bld: 94 mg/dL (ref 70–99)
Potassium: 4.1 mEq/L (ref 3.5–5.1)
Sodium: 139 mEq/L (ref 135–145)

## 2022-03-14 LAB — URINALYSIS, ROUTINE W REFLEX MICROSCOPIC
Bilirubin Urine: NEGATIVE
Hgb urine dipstick: NEGATIVE
Ketones, ur: NEGATIVE
Leukocytes,Ua: NEGATIVE
Nitrite: NEGATIVE
RBC / HPF: NONE SEEN (ref 0–?)
Specific Gravity, Urine: 1.02 (ref 1.000–1.030)
Total Protein, Urine: NEGATIVE
Urine Glucose: NEGATIVE
Urobilinogen, UA: 1 (ref 0.0–1.0)
pH: 7 (ref 5.0–8.0)

## 2022-03-14 LAB — HEPATIC FUNCTION PANEL
ALT: 28 U/L (ref 0–53)
AST: 20 U/L (ref 0–37)
Albumin: 4.3 g/dL (ref 3.5–5.2)
Alkaline Phosphatase: 50 U/L (ref 39–117)
Bilirubin, Direct: 0.1 mg/dL (ref 0.0–0.3)
Total Bilirubin: 0.6 mg/dL (ref 0.2–1.2)
Total Protein: 7.2 g/dL (ref 6.0–8.3)

## 2022-03-14 LAB — HEMOGLOBIN A1C: Hgb A1c MFr Bld: 6 % (ref 4.6–6.5)

## 2022-03-14 LAB — TSH: TSH: 1.87 u[IU]/mL (ref 0.35–5.50)

## 2022-03-14 LAB — LIPID PANEL
Cholesterol: 228 mg/dL — ABNORMAL HIGH (ref 0–200)
HDL: 48.1 mg/dL (ref >39.00)
NonHDL: 180.11
Total CHOL/HDL Ratio: 5
Triglycerides: 259 mg/dL — ABNORMAL HIGH (ref 0.0–149.0)
VLDL: 51.8 mg/dL — ABNORMAL HIGH (ref 0.0–40.0)

## 2022-03-14 LAB — PSA: PSA: 0.42 ng/mL (ref 0.10–4.00)

## 2022-03-14 LAB — LDL CHOLESTEROL, DIRECT: Direct LDL: 153 mg/dL

## 2022-03-14 MED ORDER — POTASSIUM CHLORIDE CRYS ER 20 MEQ PO TBCR: 20.0000 meq | EXTENDED_RELEASE_TABLET | Freq: Two times a day (BID) | ORAL | 1 refills | Status: DC

## 2022-03-14 MED ORDER — INDAPAMIDE 2.5 MG PO TABS: 2.5000 mg | ORAL_TABLET | Freq: Every day | ORAL | 1 refills | Status: DC

## 2022-03-14 MED ORDER — VALSARTAN 320 MG PO TABS: 320.0000 mg | ORAL_TABLET | Freq: Every day | ORAL | 1 refills | Status: DC

## 2022-03-14 MED ORDER — TESTOSTERONE 20.25 MG/ACT (1.62%) TD GEL: 2.0000 | Freq: Every day | TRANSDERMAL | 1 refills | Status: DC

## 2022-03-14 MED ORDER — TADALAFIL 20 MG PO TABS: ORAL_TABLET | ORAL | 1 refills | Status: DC

## 2022-03-14 NOTE — Patient Instructions (Signed)
Health Maintenance, Male Adopting a healthy lifestyle and getting preventive care are important in promoting health and wellness. Ask your health care provider about: The right schedule for you to have regular tests and exams. Things you can do on your own to prevent diseases and keep yourself healthy. What should I know about diet, weight, and exercise? Eat a healthy diet  Eat a diet that includes plenty of vegetables, fruits, low-fat dairy products, and lean protein. Do not eat a lot of foods that are high in solid fats, added sugars, or sodium. Maintain a healthy weight Body mass index (BMI) is a measurement that can be used to identify possible weight problems. It estimates body fat based on height and weight. Your health care provider can help determine your BMI and help you achieve or maintain a healthy weight. Get regular exercise Get regular exercise. This is one of the most important things you can do for your health. Most adults should: Exercise for at least 150 minutes each week. The exercise should increase your heart rate and make you sweat (moderate-intensity exercise). Do strengthening exercises at least twice a week. This is in addition to the moderate-intensity exercise. Spend less time sitting. Even light physical activity can be beneficial. Watch cholesterol and blood lipids Have your blood tested for lipids and cholesterol at 59 years of age, then have this test every 5 years. You may need to have your cholesterol levels checked more often if: Your lipid or cholesterol levels are high. You are older than 59 years of age. You are at high risk for heart disease. What should I know about cancer screening? Many types of cancers can be detected early and may often be prevented. Depending on your health history and family history, you may need to have cancer screening at various ages. This may include screening for: Colorectal cancer. Prostate cancer. Skin cancer. Lung  cancer. What should I know about heart disease, diabetes, and high blood pressure? Blood pressure and heart disease High blood pressure causes heart disease and increases the risk of stroke. This is more likely to develop in people who have high blood pressure readings or are overweight. Talk with your health care provider about your target blood pressure readings. Have your blood pressure checked: Every 3-5 years if you are 18-39 years of age. Every year if you are 40 years old or older. If you are between the ages of 65 and 75 and are a current or former smoker, ask your health care provider if you should have a one-time screening for abdominal aortic aneurysm (AAA). Diabetes Have regular diabetes screenings. This checks your fasting blood sugar level. Have the screening done: Once every three years after age 45 if you are at a normal weight and have a low risk for diabetes. More often and at a younger age if you are overweight or have a high risk for diabetes. What should I know about preventing infection? Hepatitis B If you have a higher risk for hepatitis B, you should be screened for this virus. Talk with your health care provider to find out if you are at risk for hepatitis B infection. Hepatitis C Blood testing is recommended for: Everyone born from 1945 through 1965. Anyone with known risk factors for hepatitis C. Sexually transmitted infections (STIs) You should be screened each year for STIs, including gonorrhea and chlamydia, if: You are sexually active and are younger than 59 years of age. You are older than 59 years of age and your   health care provider tells you that you are at risk for this type of infection. Your sexual activity has changed since you were last screened, and you are at increased risk for chlamydia or gonorrhea. Ask your health care provider if you are at risk. Ask your health care provider about whether you are at high risk for HIV. Your health care provider  may recommend a prescription medicine to help prevent HIV infection. If you choose to take medicine to prevent HIV, you should first get tested for HIV. You should then be tested every 3 months for as long as you are taking the medicine. Follow these instructions at home: Alcohol use Do not drink alcohol if your health care provider tells you not to drink. If you drink alcohol: Limit how much you have to 0-2 drinks a day. Know how much alcohol is in your drink. In the [place] one drink equals one 12 oz bottle of beer (355 mL), one 5 oz glass of wine (148 mL), or one 1 oz glass of hard liquor (44 mL). Lifestyle Do not use any products that contain nicotine or tobacco. These products include cigarettes, chewing tobacco, and vaping devices, such as e-cigarettes. If you need help quitting, ask your health care provider. Do not use street drugs. Do not share needles. Ask your health care provider for help if you need support or information about quitting drugs. General instructions Schedule regular health, dental, and eye exams. Stay current with your vaccines. Tell your health care provider if: You often feel depressed. You have ever been abused or do not feel safe at home. Summary Adopting a healthy lifestyle and getting preventive care are important in promoting health and wellness. Follow your health care provider's instructions about healthy diet, exercising, and getting tested or screened for diseases. Follow your health care provider's instructions on monitoring your cholesterol and blood pressure. This information is not intended to replace advice given to you by your health care provider. Make sure you discuss any questions you have with your health care provider. Document Revised: 10/02/2020 Document Reviewed: 10/02/2020 Elsevier Patient Education  2023 [employer]  

## 2022-03-14 NOTE — Progress Notes (Signed)
Subjective:  Patient ID: Lawrence Smith, male    DOB: [birth year]  Age: 61 y.o. MRN: [id number]  CC: Annual Exam, Hypertension, and Hyperlipidemia   HPI [name] presents for a CPX and f/up -  He is very active and has good endurance.  He denies chest pain, shortness of breath, diaphoresis, or edema.  Outpatient Medications Prior to Visit  Medication Sig Dispense Refill   aspirin 81 MG chewable tablet Chew 81 mg by mouth daily.     valACYclovir (VALTREX) 1000 MG tablet TAKE 1 TABLET TWICE A DAY 20 tablet 1   indapamide (LOZOL) 2.5 MG tablet TAKE 1 TABLET DAILY 90 tablet 0   KLOR-CON M20 20 MEQ tablet TAKE 1 TABLET TWICE A DAY 180 tablet 1   tadalafil (CIALIS) 20 MG tablet TAKE 1 TABLET DAILY AS NEEDED. (GENERIC EQUIVALENT FOR CIALIS) 12 tablet 1   Testosterone 20.25 MG/ACT (1.62%) GEL APPLY 2 PUMPS TOPICALLY DAILY 225 g 0   valsartan (DIOVAN) 320 MG tablet TAKE 1 TABLET DAILY 90 tablet 0   No facility-administered medications prior to visit.    ROS Review of Systems  Constitutional: Negative.  Negative for diaphoresis and fatigue.  HENT: Negative.    Eyes: Negative.   Respiratory:  Negative for cough, chest tightness, shortness of breath and wheezing.   Cardiovascular:  Negative for chest pain, palpitations and leg swelling.  Gastrointestinal:  Negative for abdominal pain and diarrhea.  Endocrine: Negative.   Genitourinary: Negative.  Negative for difficulty urinating.  Musculoskeletal:  Negative for arthralgias and myalgias.  Skin: Negative.   Allergic/Immunologic: Negative.   Neurological: Negative.  Negative for dizziness and light-headedness.  Hematological:  Negative for adenopathy. Does not bruise/bleed easily.  Psychiatric/Behavioral: Negative.      Objective:  BP 134/88 (BP Location: Right Arm, Patient Position: Sitting, Cuff Size: Large)   Pulse 70   Temp 98 F (36.7 C) (Oral)   Ht '5\' 10"'$  (1.778 m)   Wt 203 lb (92.1 kg)   SpO2 94%   BMI 29.13 kg/m    BP Readings from Last 3 Encounters:  03/14/22 134/88  08/23/21 136/84  05/23/21 (!) 148/98    Wt Readings from Last 3 Encounters:  03/14/22 203 lb (92.1 kg)  08/23/21 204 lb (92.5 kg)  05/23/21 206 lb (93.4 kg)    Physical Exam Vitals reviewed.  HENT:     Nose: Nose normal.     Mouth/Throat:     Mouth: Mucous membranes are moist.  Eyes:     General: No scleral icterus.    Conjunctiva/sclera: Conjunctivae normal.  Cardiovascular:     Rate and Rhythm: Normal rate and regular rhythm.     Heart sounds: Normal heart sounds, S1 normal and S2 normal. No murmur heard.    Comments: EKG- NSR with SA ?LAE No LVH or Q waves Pulmonary:     Effort: Pulmonary effort is normal.     Breath sounds: No stridor. No wheezing, rhonchi or rales.  Abdominal:     General: Abdomen is flat.     Palpations: There is no mass.     Tenderness: There is no abdominal tenderness. There is no guarding.     Hernia: No hernia is present. There is no hernia in the left inguinal area or right inguinal area.  Genitourinary:    Pubic Area: No rash.      Penis: Normal and circumcised.      Testes: Normal.     Epididymis:  Right: Normal.     Left: Normal.     Prostate: Normal. Not enlarged, not tender and no nodules present.     Rectum: Normal. Guaiac result negative. No mass, tenderness, anal fissure, external hemorrhoid or internal hemorrhoid. Normal anal tone.  Musculoskeletal:     Cervical back: Neck supple.     Right lower leg: No edema.     Left lower leg: No edema.  Lymphadenopathy:     Cervical: No cervical adenopathy.     Lower Body: No right inguinal adenopathy. No left inguinal adenopathy.  Skin:    General: Skin is warm and dry.  Neurological:     General: No focal deficit present.     Mental Status: He is alert.  Psychiatric:        Mood and Affect: Mood normal.        Behavior: Behavior normal.     Lab Results  Component Value Date   WBC 7.9 03/14/2022   HGB 15.8  03/14/2022   HCT 47.4 03/14/2022   PLT 268.0 03/14/2022   GLUCOSE 94 03/14/2022   CHOL 228 (H) 03/14/2022   TRIG 259.0 (H) 03/14/2022   HDL 48.10 03/14/2022   LDLDIRECT 153.0 03/14/2022   LDLCALC 153 (H) 10/18/2019   ALT 28 03/14/2022   AST 20 03/14/2022   NA 139 03/14/2022   K 4.1 03/14/2022   CL 100 03/14/2022   CREATININE 0.86 03/14/2022   BUN 22 03/14/2022   CO2 34 (H) 03/14/2022   TSH 1.87 03/14/2022   PSA 0.42 03/14/2022   HGBA1C 6.0 03/14/2022    No results found.  Assessment & Plan:   Caylan was seen today for annual exam, hypertension and hyperlipidemia.  Diagnoses and all orders for this visit:  BPH with obstruction/lower urinary tract symptoms- He has no symptoms and his PSA is normal. -     Urinalysis, Routine w reflex microscopic; Future -     Urinalysis, Routine w reflex microscopic  Essential hypertension- His EKG is reassuring.  His blood pressure is adequately well controlled. -     EKG 12-Lead -     Basic metabolic panel; Future -     CBC with Differential/Platelet; Future -     TSH; Future -     Urinalysis, Routine w reflex microscopic; Future -     Urinalysis, Routine w reflex microscopic -     TSH -     CBC with Differential/Platelet -     Basic metabolic panel -     valsartan (DIOVAN) 320 MG tablet; Take 1 tablet (320 mg total) by mouth daily. -     indapamide (LOZOL) 2.5 MG tablet; Take 1 tablet (2.5 mg total) by mouth daily. -     potassium chloride SA (KLOR-CON M20) 20 MEQ tablet; Take 1 tablet (20 mEq total) by mouth 2 (two) times daily.  Diuretic-induced hypokalemia -     Basic metabolic panel; Future -     Basic metabolic panel -     potassium chloride SA (KLOR-CON M20) 20 MEQ tablet; Take 1 tablet (20 mEq total) by mouth 2 (two) times daily.  Drug-induced erectile dysfunction -     tadalafil (CIALIS) 20 MG tablet; TAKE 1 TABLET DAILY AS NEEDED. (GENERIC EQUIVALENT FOR CIALIS)  Hyperlipidemia LDL goal <130- Statin is not indicated. -      Lipid panel; Future -     TSH; Future -     Hepatic function panel; Future -     Hepatic  function panel -     TSH -     Lipid panel  Chronic hyperglycemia- He has mild prediabetes. -     Basic metabolic panel; Future -     Hemoglobin A1c; Future -     Hemoglobin A1c -     Basic metabolic panel  Routine general medical examination at a health care facility- Exam completed, labs reviewed, vaccines reviewed and updated, cancer screenings are up-to-date, patient education was given. 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  Other orders -     Flu Vaccine QUAD 6+ mos PF IM (Fluarix Quad PF) -     LDL cholesterol, direct   I have changed Jachin A. Fojtik's Klor-Con M20 to potassium chloride SA. I have also changed his valsartan, indapamide, and Testosterone. I am also having him maintain his aspirin, valACYclovir, and tadalafil.  Meds ordered this encounter  Medications   valsartan (DIOVAN) 320 MG tablet    Sig: Take 1 tablet (320 mg total) by mouth daily.    Dispense:  90 tablet    Refill:  1   tadalafil (CIALIS) 20 MG tablet    Sig: TAKE 1 TABLET DAILY AS NEEDED. (GENERIC EQUIVALENT FOR CIALIS)    Dispense:  12 tablet    Refill:  1   indapamide (LOZOL) 2.5 MG tablet    Sig: Take 1 tablet (2.5 mg total) by mouth daily.    Dispense:  90 tablet    Refill:  1   potassium chloride SA (KLOR-CON M20) 20 MEQ tablet    Sig: Take 1 tablet (20 mEq total) by mouth 2 (two) times daily.    Dispense:  180 tablet    Refill:  1   Testosterone 20.25 MG/ACT (1.62%) GEL    Sig: Place 2 Pump onto the skin daily.    Dispense:  225 g    Refill:  1     Follow-up: Return in about 6 months (around 09/13/2022).  Scarlette Calico, MD

## 2022-03-15 ENCOUNTER — Other Ambulatory Visit: Payer: Self-pay | Admitting: Internal Medicine

## 2022-03-15 DIAGNOSIS — E291 Testicular hypofunction: Secondary | ICD-10-CM

## 2022-03-18 ENCOUNTER — Encounter: Payer: Self-pay | Admitting: Internal Medicine

## 2022-07-15 DIAGNOSIS — D2261 Melanocytic nevi of right upper limb, including shoulder: Secondary | ICD-10-CM | POA: Diagnosis not present

## 2022-07-15 DIAGNOSIS — Z85820 Personal history of malignant melanoma of skin: Secondary | ICD-10-CM | POA: Diagnosis not present

## 2022-07-15 DIAGNOSIS — L821 Other seborrheic keratosis: Secondary | ICD-10-CM | POA: Diagnosis not present

## 2022-07-15 DIAGNOSIS — D225 Melanocytic nevi of trunk: Secondary | ICD-10-CM | POA: Diagnosis not present

## 2022-07-15 DIAGNOSIS — D485 Neoplasm of uncertain behavior of skin: Secondary | ICD-10-CM | POA: Diagnosis not present

## 2022-07-16 ENCOUNTER — Encounter: Payer: Self-pay | Admitting: Internal Medicine

## 2022-07-17 ENCOUNTER — Emergency Department (HOSPITAL_BASED_OUTPATIENT_CLINIC_OR_DEPARTMENT_OTHER)
Admission: EM | Admit: 2022-07-17 | Discharge: 2022-07-17 | Disposition: A | Discharge status: Home / Self Care | Payer: BC Managed Care – PPO | Attending: Emergency Medicine | Admitting: Emergency Medicine

## 2022-07-17 ENCOUNTER — Other Ambulatory Visit: Payer: Self-pay

## 2022-07-17 ENCOUNTER — Emergency Department (HOSPITAL_BASED_OUTPATIENT_CLINIC_OR_DEPARTMENT_OTHER): Payer: BC Managed Care – PPO

## 2022-07-17 ENCOUNTER — Encounter (HOSPITAL_BASED_OUTPATIENT_CLINIC_OR_DEPARTMENT_OTHER): Payer: Self-pay | Admitting: *Deleted

## 2022-07-17 DIAGNOSIS — Z85828 Personal history of other malignant neoplasm of skin: Secondary | ICD-10-CM | POA: Diagnosis not present

## 2022-07-17 DIAGNOSIS — E876 Hypokalemia: Secondary | ICD-10-CM | POA: Diagnosis not present

## 2022-07-17 DIAGNOSIS — Z7982 Long term (current) use of aspirin: Secondary | ICD-10-CM | POA: Diagnosis not present

## 2022-07-17 DIAGNOSIS — M5412 Radiculopathy, cervical region: Secondary | ICD-10-CM | POA: Diagnosis not present

## 2022-07-17 DIAGNOSIS — I6522 Occlusion and stenosis of left carotid artery: Secondary | ICD-10-CM | POA: Diagnosis not present

## 2022-07-17 DIAGNOSIS — M47812 Spondylosis without myelopathy or radiculopathy, cervical region: Secondary | ICD-10-CM | POA: Diagnosis not present

## 2022-07-17 DIAGNOSIS — H9312 Tinnitus, left ear: Secondary | ICD-10-CM | POA: Diagnosis not present

## 2022-07-17 DIAGNOSIS — M4802 Spinal stenosis, cervical region: Secondary | ICD-10-CM | POA: Diagnosis not present

## 2022-07-17 DIAGNOSIS — R42 Dizziness and giddiness: Secondary | ICD-10-CM | POA: Diagnosis not present

## 2022-07-17 DIAGNOSIS — Z85820 Personal history of malignant melanoma of skin: Secondary | ICD-10-CM | POA: Insufficient documentation

## 2022-07-17 LAB — CBC WITH DIFFERENTIAL/PLATELET
Abs Immature Granulocytes: 0.02 10*3/uL (ref 0.00–0.07)
Basophils Absolute: 0 10*3/uL (ref 0.0–0.1)
Basophils Relative: 0 %
Eosinophils Absolute: 0.1 10*3/uL (ref 0.0–0.5)
Eosinophils Relative: 2 %
HCT: 51.6 % (ref 39.0–52.0)
Hemoglobin: 17.6 g/dL — ABNORMAL HIGH (ref 13.0–17.0)
Immature Granulocytes: 0 %
Lymphocytes Relative: 24 %
Lymphs Abs: 1.7 10*3/uL (ref 0.7–4.0)
MCH: 30.3 pg (ref 26.0–34.0)
MCHC: 34.1 g/dL (ref 30.0–36.0)
MCV: 88.8 fL (ref 80.0–100.0)
Monocytes Absolute: 0.6 10*3/uL (ref 0.1–1.0)
Monocytes Relative: 9 %
Neutro Abs: 4.4 10*3/uL (ref 1.7–7.7)
Neutrophils Relative %: 65 %
Platelets: 266 10*3/uL (ref 150–400)
RBC: 5.81 MIL/uL (ref 4.22–5.81)
RDW: 13 % (ref 11.5–15.5)
WBC: 6.8 10*3/uL (ref 4.0–10.5)
nRBC: 0 % (ref 0.0–0.2)

## 2022-07-17 LAB — COMPREHENSIVE METABOLIC PANEL
ALT: 29 U/L (ref 0–44)
AST: 25 U/L (ref 15–41)
Albumin: 4.5 g/dL (ref 3.5–5.0)
Alkaline Phosphatase: 50 U/L (ref 38–126)
Anion gap: 8 (ref 5–15)
BUN: 17 mg/dL (ref 6–20)
CO2: 28 mmol/L (ref 22–32)
Calcium: 9.4 mg/dL (ref 8.9–10.3)
Chloride: 100 mmol/L (ref 98–111)
Creatinine, Ser: 0.87 mg/dL (ref 0.61–1.24)
GFR, Estimated: >60 mL/min (ref >60)
Glucose, Bld: 114 mg/dL — ABNORMAL HIGH (ref 70–99)
Potassium: 3.2 mmol/L — ABNORMAL LOW (ref 3.5–5.1)
Sodium: 136 mmol/L (ref 135–145)
Total Bilirubin: 1.3 mg/dL — ABNORMAL HIGH (ref 0.3–1.2)
Total Protein: 8.5 g/dL — ABNORMAL HIGH (ref 6.5–8.1)

## 2022-07-17 LAB — URINALYSIS, ROUTINE W REFLEX MICROSCOPIC
Bilirubin Urine: NEGATIVE
Glucose, UA: NEGATIVE mg/dL
Hgb urine dipstick: NEGATIVE
Ketones, ur: NEGATIVE mg/dL
Leukocytes,Ua: NEGATIVE
Nitrite: NEGATIVE
Protein, ur: NEGATIVE mg/dL
Specific Gravity, Urine: 1.015 (ref 1.005–1.030)
pH: 7.5 (ref 5.0–8.0)

## 2022-07-17 LAB — MAGNESIUM: Magnesium: 1.8 mg/dL (ref 1.7–2.4)

## 2022-07-17 MED ORDER — LORAZEPAM 2 MG/ML IJ SOLN
1.0000 mg | Freq: Once | INTRAMUSCULAR | Status: AC | PRN
  Administered 2022-07-17: 1 mg via INTRAVENOUS
  Filled 2022-07-17: qty 1

## 2022-07-17 MED ORDER — PREDNISONE 50 MG PO TABS: 50.0000 mg | ORAL_TABLET | Freq: Every day | ORAL | 0 refills | Status: DC

## 2022-07-17 MED ORDER — KETOROLAC TROMETHAMINE 30 MG/ML IJ SOLN
30.0000 mg | Freq: Once | INTRAMUSCULAR | Status: AC
  Administered 2022-07-17: 30 mg via INTRAVENOUS
  Filled 2022-07-17: qty 1

## 2022-07-17 MED ORDER — MONTELUKAST SODIUM 10 MG PO TABS: 10.0000 mg | ORAL_TABLET | Freq: Every day | ORAL | 0 refills | Status: DC

## 2022-07-17 MED ORDER — POTASSIUM CHLORIDE 10 MEQ/100ML IV SOLN
10.0000 meq | Freq: Once | INTRAVENOUS | Status: AC
  Administered 2022-07-17: 10 meq via INTRAVENOUS
  Filled 2022-07-17: qty 100

## 2022-07-17 MED ORDER — MECLIZINE HCL 25 MG PO TABS: 25.0000 mg | ORAL_TABLET | Freq: Three times a day (TID) | ORAL | 0 refills | Status: DC | PRN

## 2022-07-17 MED ORDER — MECLIZINE HCL 25 MG PO TABS
25.0000 mg | ORAL_TABLET | Freq: Once | ORAL | Status: AC
  Administered 2022-07-17: 25 mg via ORAL
  Filled 2022-07-17: qty 1

## 2022-07-17 MED ORDER — GADOBUTROL 1 MMOL/ML IV SOLN
10.0000 mL | Freq: Once | INTRAVENOUS | Status: AC | PRN
  Administered 2022-07-17: 10 mL via INTRAVENOUS

## 2022-07-17 MED ORDER — SODIUM CHLORIDE 0.9 % IV BOLUS
1000.0000 mL | Freq: Once | INTRAVENOUS | Status: AC
  Administered 2022-07-17: 1000 mL via INTRAVENOUS

## 2022-07-17 NOTE — ED Notes (Signed)
Returns from MRI

## 2022-07-17 NOTE — ED Triage Notes (Signed)
HA, dizziness, pressure behind both ears, onset approx  a few weeks, pressure is more behind left ear, feels fullness, "slight dizziness" "very intermittent"

## 2022-07-17 NOTE — ED Notes (Addendum)
BEFAST /VAN at present NEGATIVE Main complaint is pressure behind left ear area During NIH / Neuro exam client noted to have hand shakes bilaterly

## 2022-07-17 NOTE — ED Notes (Signed)
To MRI via WC, with MRI Tech. IVF bolus paused

## 2022-07-17 NOTE — Discharge Instructions (Addendum)
Potassium-Rich Foods Fruits, vegetables, and meat are all high in potassium. Here's a list of potassium-rich foods:  Dried apricots (2,202 mg per cup) Avocado (690 mg per avocado) Banana (422 mg per banana) Cantaloupe (428 mg per cup) Spinach (271 mg per cup) Asparagus (271 mg per cup) Tomato (292 mg per tomato) Potato (610 mg per medium potato) Lentils (731 mg per cup) Salmon (624 mg per 6 ounce filet) Chicken breast (332 mg per 3 ounces) Beef (315 mg per 3 ounces) 1% milk (366 mg per cup) Foods High In Both Magnesium and Potassium If you want a double dose of these electrolytes, eat dark leafy greens. Spinach in particular is an excellent source of both magnesium and potassium.  But don't forget fruits (avocado, banana, apple), starchy vegetables (potatoes, yams, carrots), legumes, and meat and fish like chicken, beef, and salmon.

## 2022-07-17 NOTE — ED Notes (Signed)
C/o of HA, per ED MD ok to give prev dose of Toradol 11m IV

## 2022-07-17 NOTE — ED Notes (Signed)
Placed on cont cardiac monitor and ECG performed.

## 2022-07-17 NOTE — ED Provider Notes (Signed)
[facility] Provider Note   CSN: [id number] Arrival date & time: 07/17/22  [id number]     History  Chief Complaint  Patient presents with   Dizziness    Lawrence Smith is a 62 y.o. male.  Pt is a 62 yo male with pmhx significant for htn, BPH, ED, kidney stones, melanoma, and basal cell cancer.  Pt has been having headaches for several months.  He's been feeling intermittently dizzy.  He has ringing in his left ear.  He denies hearing loss.  He feels like there is something behind his ear.  He has tremors in both hands.  He has some numbness on the left face and in his left arm.  He is speaking normally.  He is seeing normally.       Home Medications Prior to Admission medications   Medication Sig Start Date End Date Taking? Authorizing Provider  aspirin 81 MG chewable tablet Chew 81 mg by mouth daily.   Yes [provider]  indapamide (LOZOL) 2.5 MG tablet Take 1 tablet (2.5 mg total) by mouth daily. 03/14/22  Yes Janith Lima, MD  meclizine (ANTIVERT) 25 MG tablet Take 1 tablet (25 mg total) by mouth 3 (three) times daily as needed for dizziness. 07/17/22  Yes Isla Pence, MD  montelukast (SINGULAIR) 10 MG tablet Take 1 tablet (10 mg total) by mouth at bedtime. 07/17/22  Yes Isla Pence, MD  potassium chloride SA (KLOR-CON M20) 20 MEQ tablet Take 1 tablet (20 mEq total) by mouth 2 (two) times daily. 03/14/22  Yes Janith Lima, MD  predniSONE (DELTASONE) 50 MG tablet Take 1 tablet (50 mg total) by mouth daily with breakfast. 07/17/22  Yes Isla Pence, MD  Testosterone 20.25 MG/ACT (1.62%) GEL Place 2 Pump onto the skin daily. 03/14/22  Yes Janith Lima, MD  valsartan (DIOVAN) 320 MG tablet Take 1 tablet (320 mg total) by mouth daily. 03/14/22  Yes Janith Lima, MD  tadalafil (CIALIS) 20 MG tablet TAKE 1 TABLET DAILY AS NEEDED. (GENERIC EQUIVALENT FOR CIALIS) 03/14/22   Janith Lima, MD  valACYclovir (VALTREX)  1000 MG tablet TAKE 1 TABLET TWICE A DAY 12/05/21   Janith Lima, MD      Allergies    Penicillins    Review of Systems   Review of Systems  HENT:  Positive for ear pain and tinnitus.   Neurological:  Positive for dizziness, tremors and numbness.  All other systems reviewed and are negative.   Physical Exam Updated Vital Signs BP (!) 156/98 (BP Location: Left Arm)   Pulse 99   Temp 98 F (36.7 C)   Resp 18   Ht 5' 9"$  (1.753 m)   Wt 92.9 kg   SpO2 99%   BMI 30.24 kg/m  Physical Exam Vitals and nursing note reviewed.  Constitutional:      Appearance: Normal appearance.  HENT:     Head: Normocephalic and atraumatic.     Right Ear: External ear normal.     Left Ear: External ear normal.     Nose: Nose normal.     Mouth/Throat:     Mouth: Mucous membranes are moist.     Pharynx: Oropharynx is clear.  Eyes:     Extraocular Movements: Extraocular movements intact.     Conjunctiva/sclera: Conjunctivae normal.     Pupils: Pupils are equal, round, and reactive to light.  Cardiovascular:     Rate and Rhythm: Normal  rate and regular rhythm.     Pulses: Normal pulses.     Heart sounds: Normal heart sounds.  Pulmonary:     Effort: Pulmonary effort is normal.     Breath sounds: Normal breath sounds.  Abdominal:     General: Abdomen is flat. Bowel sounds are normal.     Palpations: Abdomen is soft.  Musculoskeletal:        General: Normal range of motion.     Cervical back: Normal range of motion and neck supple.  Skin:    General: Skin is warm.     Capillary Refill: Capillary refill takes less than 2 seconds.  Neurological:     General: No focal deficit present.     Mental Status: He is alert and oriented to person, place, and time.     Motor: Tremor present.     Comments: Both arm tremor  Psychiatric:        Mood and Affect: Mood normal.        Behavior: Behavior normal.     ED Results / Procedures / Treatments   Labs (all labs ordered are listed, but only  abnormal results are displayed) Labs Reviewed  COMPREHENSIVE METABOLIC PANEL - Abnormal; Notable for the following components:      Result Value   Potassium 3.2 (*)    Glucose, Bld 114 (*)    Total Protein 8.5 (*)    Total Bilirubin 1.3 (*)    All other components within normal limits  CBC WITH DIFFERENTIAL/PLATELET - Abnormal; Notable for the following components:   Hemoglobin 17.6 (*)    All other components within normal limits  URINALYSIS, ROUTINE W REFLEX MICROSCOPIC  MAGNESIUM    EKG EKG Interpretation  Date/Time:  Wednesday July 17 2022 08:53:25 EST Ventricular Rate:  87 PR Interval:  160 QRS Duration: 99 QT Interval:  372 QTC Calculation: 448 R Axis:   7 Text Interpretation: Sinus rhythm Probable left atrial enlargement No significant change since last tracing Confirmed by Isla Pence [phone]) on 07/17/2022 9:09:05 AM  Radiology MR BRAIN/IAC W WO CONTRAST  Result Date: 07/17/2022 CLINICAL DATA:  Dizzinesses.  Chronic left ear pain. EXAM: MRI HEAD WITHOUT AND WITH CONTRAST TECHNIQUE: Multiplanar, multiecho pulse sequences of the brain and surrounding structures were obtained without and with intravenous contrast. CONTRAST:  23m GADAVIST GADOBUTROL 1 MMOL/ML IV SOLN COMPARISON:  None Available. FINDINGS: Brain: No acute infarction, hemorrhage, hydrocephalus, extra-axial collection or mass lesion. Dedicated IAC protocol was performed. The visualized seventh and eighth cranial nerves are unremarkable. No pathologic enhancement or visible retrocochlear mass. Normal CSF signal within the labyrinth bilaterally. No mastoid effusions. Vascular: Major arterial flow voids are maintained at the skull base. Skull and upper cervical spine: Normal marrow signal. Sinuses/Orbits: Mild paranasal sinus mucosal thickening. No acute orbital findings. IMPRESSION: No evidence of acute intracranial abnormality or retrocochlear mass. Electronically Signed   By: FMargaretha SheffieldM.D.   On:  07/17/2022 11:03   MR Cervical Spine Wo Contrast  Result Date: 07/17/2022 CLINICAL DATA:  Cervical radiculopathy, no red flags EXAM: MRI CERVICAL SPINE WITHOUT CONTRAST TECHNIQUE: Multiplanar, multisequence MR imaging of the cervical spine was performed. No intravenous contrast was administered. COMPARISON:  None Available. FINDINGS: Alignment: Straightening of the cervical lordosis without static listhesis. Vertebrae: No fracture, evidence of discitis, or bone lesion. Cord: Normal signal and morphology. Posterior Fossa, vertebral arteries, paraspinal tissues: Negative. Disc levels: C2-C3: No disc protrusion. Mild right-sided facet arthropathy and minimal uncovertebral spurring. Mild right foraminal stenosis.  No canal stenosis. C3-C4: Disc osteophyte complex with prominent right uncovertebral spurring and right greater than left facet arthropathy. Moderate to severe right and mild left foraminal stenosis. No significant canal stenosis. C4-C5: No disc protrusion. Mild right greater than left facet arthropathy as well as right uncovertebral spurring. Mild-moderate right foraminal stenosis. No significant canal stenosis. C5-C6: Disc osteophyte complex, eccentric to the right with right-sided uncovertebral spurring. Minimal facet hypertrophy. Moderate right foraminal stenosis and mild canal stenosis. C6-C7: Right paracentral disc protrusion resulting in mild impress upon the right hemicord. Right greater than left uncovertebral spurring. Mild canal stenosis with moderate right foraminal stenosis. C7-T1: No disc protrusion. Minimal right uncovertebral spurring and mild facet hypertrophy. Mild right foraminal stenosis. No canal stenosis. IMPRESSION: 1. Multilevel cervical spondylosis, most pronounced at the C3-4 level where there is moderate-to-severe right and mild left foraminal stenosis. 2. Mild canal stenosis and moderate right foraminal stenosis at C5-6 and C6-7. Electronically Signed   By: Davina Poke D.O.    On: 07/17/2022 10:53    Procedures Procedures    Medications Ordered in ED Medications  ketorolac (TORADOL) 30 MG/ML injection 30 mg (30 mg Intravenous Patient Refused/Not Given 07/17/22 0919)  potassium chloride 10 mEq in 100 mL IVPB (10 mEq Intravenous New Bag/Given 07/17/22 1044)  sodium chloride 0.9 % bolus 1,000 mL (1,000 mLs Intravenous New Bag/Given 07/17/22 0914)  meclizine (ANTIVERT) tablet 25 mg (25 mg Oral Given 07/17/22 0918)  LORazepam (ATIVAN) injection 1 mg (1 mg Intravenous Given 07/17/22 0919)  gadobutrol (GADAVIST) 1 MMOL/ML injection 10 mL (10 mLs Intravenous Contrast Given 07/17/22 1028)    ED Course/ Medical Decision Making/ A&P                             Medical Decision Making Amount and/or Complexity of Data Reviewed Labs: ordered. Radiology: ordered.  Risk Prescription drug management.   This patient presents to the ED for concern of dizziness, numbness, tremors, this involves an extensive number of treatment options, and is a complaint that carries with it a high risk of complications and morbidity.  The differential diagnosis includes meniere's, vertigo, cbi, cervical abn   Co morbidities that complicate the patient evaluation  htn, BPH, ED, kidney stones, melanoma, and basal cell cancer   Additional history obtained:  Additional history obtained from epic chart review External records from outside source obtained and reviewed including wife   Lab Tests:  I Ordered, and personally interpreted labs.  The pertinent results include:  cmp with k low at 3.2, mg nl at 1.8, ua nl, cbc nl   Imaging Studies ordered:  I ordered imaging studies including mri brain and iac, mri cervical spine  I independently visualized and interpreted imaging which showed  MRI brain/IAC: IMPRESSION:  No evidence of acute intracranial abnormality or retrocochlear mass.  MRI cervical: Multilevel cervical spondylosis, most pronounced at the C3-4  level where there is  moderate-to-severe right and mild left  foraminal stenosis.  2. Mild canal stenosis and moderate right foraminal stenosis at C5-6  and C6-7.   I agree with the radiologist interpretation   Cardiac Monitoring:  The patient was maintained on a cardiac monitor.  I personally viewed and interpreted the cardiac monitored which showed an underlying rhythm of: nsr   Medicines ordered and prescription drug management:  I ordered medication including kcl  for hypokalemia  Reevaluation of the patient after these medicines showed that the patient improved I have  reviewed the patients home medicines and have made adjustments as needed   Test Considered:  mri   Critical Interventions:  mri  Problem List / ED Course:  Dizziness/tinnitis:  likely inner ear.  No masses or abn on mri.  I will treat pt with antivert and singulair.  He needs to f/u with ENT. Cervical stenosis:  pt needs to f/u with ns.  Short burst prednisone given for sx.   Hypokalemia:  pt given 10 meq iv here.  He is given a list of foods high in k and in mg.   Reevaluation:  After the interventions noted above, I reevaluated the patient and found that they have :improved   Social Determinants of Health:  Lives at home   Dispostion:  After consideration of the diagnostic results and the patients response to treatment, I feel that the patent would benefit from discharge with outpatient f/u.          Final Clinical Impression(s) / ED Diagnoses Final diagnoses:  Dizziness  Tinnitus of left ear  Spinal stenosis of cervical region    Rx / DC Orders ED Discharge Orders          Ordered    predniSONE (DELTASONE) 50 MG tablet  Daily with breakfast        07/17/22 1125    meclizine (ANTIVERT) 25 MG tablet  3 times daily PRN        07/17/22 1125    montelukast (SINGULAIR) 10 MG tablet  Daily at bedtime        07/17/22 1125              Isla Pence, MD 07/17/22 1128

## 2022-07-17 NOTE — ED Notes (Signed)
ED Provider at bedside. 

## 2022-07-23 ENCOUNTER — Other Ambulatory Visit (HOSPITAL_COMMUNITY): Payer: Self-pay

## 2022-07-23 DIAGNOSIS — M542 Cervicalgia: Secondary | ICD-10-CM | POA: Diagnosis not present

## 2022-07-23 DIAGNOSIS — H9312 Tinnitus, left ear: Secondary | ICD-10-CM | POA: Diagnosis not present

## 2022-07-23 DIAGNOSIS — G4489 Other headache syndrome: Secondary | ICD-10-CM | POA: Diagnosis not present

## 2022-07-23 MED ORDER — METHYLPREDNISOLONE 4 MG PO TBPK
ORAL_TABLET | ORAL | 0 refills | Status: DC
  Filled 2022-07-23: qty 21, 6d supply, fill #0

## 2022-07-24 ENCOUNTER — Ambulatory Visit: Payer: BC Managed Care – PPO | Admitting: Dermatology

## 2022-07-31 ENCOUNTER — Other Ambulatory Visit (HOSPITAL_COMMUNITY): Payer: Self-pay

## 2022-07-31 MED ORDER — METHYLPREDNISOLONE 4 MG PO TBPK
ORAL_TABLET | ORAL | 0 refills | Status: DC
  Filled 2022-07-31: qty 21, 6d supply, fill #0

## 2022-08-07 DIAGNOSIS — M542 Cervicalgia: Secondary | ICD-10-CM | POA: Diagnosis not present

## 2022-08-16 DIAGNOSIS — M542 Cervicalgia: Secondary | ICD-10-CM | POA: Diagnosis not present

## 2022-09-11 ENCOUNTER — Other Ambulatory Visit: Payer: Self-pay | Admitting: Internal Medicine

## 2022-09-11 DIAGNOSIS — I10 Essential (primary) hypertension: Secondary | ICD-10-CM

## 2022-09-11 DIAGNOSIS — E876 Hypokalemia: Secondary | ICD-10-CM

## 2022-09-11 MED ORDER — VALSARTAN 320 MG PO TABS: 320.0000 mg | ORAL_TABLET | Freq: Every day | ORAL | 0 refills | Status: DC

## 2022-09-11 MED ORDER — INDAPAMIDE 2.5 MG PO TABS: 2.5000 mg | ORAL_TABLET | Freq: Every day | ORAL | 0 refills | Status: DC

## 2022-09-11 MED ORDER — POTASSIUM CHLORIDE CRYS ER 20 MEQ PO TBCR: 20.0000 meq | EXTENDED_RELEASE_TABLET | Freq: Two times a day (BID) | ORAL | 0 refills | Status: DC

## 2022-09-13 ENCOUNTER — Encounter: Payer: Self-pay | Admitting: Internal Medicine

## 2022-09-16 DIAGNOSIS — R42 Dizziness and giddiness: Secondary | ICD-10-CM | POA: Diagnosis not present

## 2022-09-16 DIAGNOSIS — H9312 Tinnitus, left ear: Secondary | ICD-10-CM | POA: Diagnosis not present

## 2022-09-16 DIAGNOSIS — R519 Headache, unspecified: Secondary | ICD-10-CM | POA: Diagnosis not present

## 2022-09-19 ENCOUNTER — Encounter: Payer: Self-pay | Admitting: Internal Medicine

## 2022-09-19 ENCOUNTER — Ambulatory Visit: Payer: BC Managed Care – PPO | Admitting: Internal Medicine

## 2022-09-19 VITALS — BP 136/88 | HR 64 | Temp 97.9°F | Resp 16 | Ht 69.0 in | Wt 205.0 lb

## 2022-09-19 DIAGNOSIS — I10 Essential (primary) hypertension: Secondary | ICD-10-CM

## 2022-09-19 DIAGNOSIS — E785 Hyperlipidemia, unspecified: Secondary | ICD-10-CM | POA: Diagnosis not present

## 2022-09-19 DIAGNOSIS — E291 Testicular hypofunction: Secondary | ICD-10-CM | POA: Diagnosis not present

## 2022-09-19 DIAGNOSIS — R739 Hyperglycemia, unspecified: Secondary | ICD-10-CM

## 2022-09-19 DIAGNOSIS — Z23 Encounter for immunization: Secondary | ICD-10-CM

## 2022-09-19 DIAGNOSIS — R9431 Abnormal electrocardiogram [ECG] [EKG]: Secondary | ICD-10-CM

## 2022-09-19 DIAGNOSIS — T502X5A Adverse effect of carbonic-anhydrase inhibitors, benzothiadiazides and other diuretics, initial encounter: Secondary | ICD-10-CM

## 2022-09-19 DIAGNOSIS — E876 Hypokalemia: Secondary | ICD-10-CM | POA: Diagnosis not present

## 2022-09-19 DIAGNOSIS — N522 Drug-induced erectile dysfunction: Secondary | ICD-10-CM

## 2022-09-19 LAB — CBC WITH DIFFERENTIAL/PLATELET
Basophils Absolute: 0 10*3/uL (ref 0.0–0.1)
Basophils Relative: 0.4 % (ref 0.0–3.0)
Eosinophils Absolute: 0.3 10*3/uL (ref 0.0–0.7)
Eosinophils Relative: 4.1 % (ref 0.0–5.0)
HCT: 48.1 % (ref 39.0–52.0)
Hemoglobin: 16.5 g/dL (ref 13.0–17.0)
Lymphocytes Relative: 22.5 % (ref 12.0–46.0)
Lymphs Abs: 1.6 10*3/uL (ref 0.7–4.0)
MCHC: 34.2 g/dL (ref 30.0–36.0)
MCV: 89.8 fl (ref 78.0–100.0)
Monocytes Absolute: 0.7 10*3/uL (ref 0.1–1.0)
Monocytes Relative: 9.6 % (ref 3.0–12.0)
Neutro Abs: 4.5 10*3/uL (ref 1.4–7.7)
Neutrophils Relative %: 63.4 % (ref 43.0–77.0)
Platelets: 265 10*3/uL (ref 150.0–400.0)
RBC: 5.36 Mil/uL (ref 4.22–5.81)
RDW: 13.6 % (ref 11.5–15.5)
WBC: 7 10*3/uL (ref 4.0–10.5)

## 2022-09-19 LAB — LIPID PANEL
Cholesterol: 212 mg/dL — ABNORMAL HIGH (ref 0–200)
HDL: 47.7 mg/dL (ref >39.00)
LDL Cholesterol: 146 mg/dL — ABNORMAL HIGH (ref 0–99)
NonHDL: 164.3
Total CHOL/HDL Ratio: 4
Triglycerides: 92 mg/dL (ref 0.0–149.0)
VLDL: 18.4 mg/dL (ref 0.0–40.0)

## 2022-09-19 LAB — BASIC METABOLIC PANEL
BUN: 22 mg/dL (ref 6–23)
CO2: 31 mEq/L (ref 19–32)
Calcium: 9.8 mg/dL (ref 8.4–10.5)
Chloride: 101 mEq/L (ref 96–112)
Creatinine, Ser: 0.9 mg/dL (ref 0.40–1.50)
GFR: 93.31 mL/min (ref >60.00)
Glucose, Bld: 84 mg/dL (ref 70–99)
Potassium: 3.9 mEq/L (ref 3.5–5.1)
Sodium: 139 mEq/L (ref 135–145)

## 2022-09-19 LAB — MAGNESIUM: Magnesium: 1.7 mg/dL (ref 1.5–2.5)

## 2022-09-19 LAB — HEMOGLOBIN A1C: Hgb A1c MFr Bld: 5.9 % (ref 4.6–6.5)

## 2022-09-19 MED ORDER — POTASSIUM CHLORIDE CRYS ER 20 MEQ PO TBCR: 20.0000 meq | EXTENDED_RELEASE_TABLET | Freq: Two times a day (BID) | ORAL | 1 refills | Status: DC

## 2022-09-19 MED ORDER — INDAPAMIDE 2.5 MG PO TABS: 2.5000 mg | ORAL_TABLET | Freq: Every day | ORAL | 1 refills | Status: DC

## 2022-09-19 MED ORDER — VALSARTAN 320 MG PO TABS: 320.0000 mg | ORAL_TABLET | Freq: Every day | ORAL | 1 refills | Status: DC

## 2022-09-19 MED ORDER — TADALAFIL 20 MG PO TABS
ORAL_TABLET | ORAL | 1 refills | Status: AC
Start: 2022-09-19 — End: ?

## 2022-09-19 MED ORDER — TESTOSTERONE 20.25 MG/ACT (1.62%) TD GEL: 2.0000 | Freq: Every day | TRANSDERMAL | 1 refills | Status: DC

## 2022-09-19 NOTE — Patient Instructions (Signed)
Hypertension, Adult High blood pressure (hypertension) is when the force of blood pumping through the arteries is too strong. The arteries are the blood vessels that carry blood from the heart throughout the body. Hypertension forces the heart to work harder to pump blood and may cause arteries to become narrow or stiff. Untreated or uncontrolled hypertension can lead to a heart attack, heart failure, a stroke, kidney disease, and other problems. A blood pressure reading consists of a higher number over a lower number. Ideally, your blood pressure should be below 120/80. The first ("top") number is called the systolic pressure. It is a measure of the pressure in your arteries as your heart beats. The second ("bottom") number is called the diastolic pressure. It is a measure of the pressure in your arteries as the heart relaxes. What are the causes? The exact cause of this condition is not known. There are some conditions that result in high blood pressure. What increases the risk? Certain factors may make you more likely to develop high blood pressure. Some of these risk factors are under your control, including: Smoking. Not getting enough exercise or physical activity. Being overweight. Having too much fat, sugar, calories, or salt (sodium) in your diet. Drinking too much alcohol. Other risk factors include: Having a personal history of heart disease, diabetes, high cholesterol, or kidney disease. Stress. Having a family history of high blood pressure and high cholesterol. Having obstructive sleep apnea. Age. The risk increases with age. What are the signs or symptoms? High blood pressure may not cause symptoms. Very high blood pressure (hypertensive crisis) may cause: Headache. Fast or irregular heartbeats (palpitations). Shortness of breath. Nosebleed. Nausea and vomiting. Vision changes. Severe chest pain, dizziness, and seizures. How is this diagnosed? This condition is diagnosed by  measuring your blood pressure while you are seated, with your arm resting on a flat surface, your legs uncrossed, and your feet flat on the floor. The cuff of the blood pressure monitor will be placed directly against the skin of your upper arm at the level of your heart. Blood pressure should be measured at least twice using the same arm. Certain conditions can cause a difference in blood pressure between your right and left arms. If you have a high blood pressure reading during one visit or you have normal blood pressure with other risk factors, you may be asked to: Return on a different day to have your blood pressure checked again. Monitor your blood pressure at home for 1 week or longer. If you are diagnosed with hypertension, you may have other blood or imaging tests to help your health care provider understand your overall risk for other conditions. How is this treated? This condition is treated by making healthy lifestyle changes, such as eating healthy foods, exercising more, and reducing your alcohol intake. You may be referred for counseling on a healthy diet and physical activity. Your health care provider may prescribe medicine if lifestyle changes are not enough to get your blood pressure under control and if: Your systolic blood pressure is above 130. Your diastolic blood pressure is above 80. Your personal target blood pressure may vary depending on your medical conditions, your age, and other factors. Follow these instructions at home: Eating and drinking  Eat a diet that is high in fiber and potassium, and low in sodium, added sugar, and fat. An example of this eating plan is called the DASH diet. DASH stands for Dietary Approaches to Stop Hypertension. To eat this way: Eat   plenty of fresh fruits and vegetables. Try to fill one half of your plate at each meal with fruits and vegetables. Eat whole grains, such as whole-wheat pasta, brown rice, or whole-grain bread. Fill about one  fourth of your plate with whole grains. Eat or drink low-fat dairy products, such as skim milk or low-fat yogurt. Avoid fatty cuts of meat, processed or cured meats, and poultry with skin. Fill about one fourth of your plate with lean proteins, such as fish, chicken without skin, beans, eggs, or tofu. Avoid pre-made and processed foods. These tend to be higher in sodium, added sugar, and fat. Reduce your daily sodium intake. Many people with hypertension should eat less than 1,500 mg of sodium a day. Do not drink alcohol if: Your health care provider tells you not to drink. You are pregnant, may be pregnant, or are planning to become pregnant. If you drink alcohol: Limit how much you have to: 0-1 drink a day for women. 0-2 drinks a day for men. Know how much alcohol is in your drink. In the [place] one drink equals one 12 oz bottle of beer (355 mL), one 5 oz glass of wine (148 mL), or one 1 oz glass of hard liquor (44 mL). Lifestyle  Work with your health care provider to maintain a healthy body weight or to lose weight. Ask what an ideal weight is for you. Get at least 30 minutes of exercise that causes your heart to beat faster (aerobic exercise) most days of the week. Activities may include walking, swimming, or biking. Include exercise to strengthen your muscles (resistance exercise), such as Pilates or lifting weights, as part of your weekly exercise routine. Try to do these types of exercises for 30 minutes at least 3 days a week. Do not use any products that contain nicotine or tobacco. These products include cigarettes, chewing tobacco, and vaping devices, such as e-cigarettes. If you need help quitting, ask your health care provider. Monitor your blood pressure at home as told by your health care provider. Keep all follow-up visits. This is important. Medicines Take over-the-counter and prescription medicines only as told by your health care provider. Follow directions carefully. Blood  pressure medicines must be taken as prescribed. Do not skip doses of blood pressure medicine. Doing this puts you at risk for problems and can make the medicine less effective. Ask your health care provider about side effects or reactions to medicines that you should watch for. Contact a health care provider if you: Think you are having a reaction to a medicine you are taking. Have headaches that keep coming back (recurring). Feel dizzy. Have swelling in your ankles. Have trouble with your vision. Get help right away if you: Develop a severe headache or confusion. Have unusual weakness or numbness. Feel faint. Have severe pain in your chest or abdomen. Vomit repeatedly. Have trouble breathing. These symptoms may be an emergency. Get help right away. Call 911. Do not wait to see if the symptoms will go away. Do not drive yourself to the hospital. Summary Hypertension is when the force of blood pumping through your arteries is too strong. If this condition is not controlled, it may put you at risk for serious complications. Your personal target blood pressure may vary depending on your medical conditions, your age, and other factors. For most people, a normal blood pressure is less than 120/80. Hypertension is treated with lifestyle changes, medicines, or a combination of both. Lifestyle changes include losing weight, eating a healthy,   low-sodium diet, exercising more, and limiting alcohol. This information is not intended to replace advice given to you by your health care provider. Make sure you discuss any questions you have with your health care provider. Document Revised: 03/20/2021 Document Reviewed: 03/20/2021 [employer]vier Patient Education  2023 [employer]  

## 2022-09-19 NOTE — Progress Notes (Signed)
Subjective:  Patient ID: Lawrence Smith, male    DOB: 1960/12/02  Age: 62 y.o. MRN: [id number]  CC: Hypertension and Hyperlipidemia   HPI Lawrence Smith presents for f/up ----   Lawrence Smith is active and denies chest pain, shortness of breath, diaphoresis, or edema.  Outpatient Medications Prior to Visit  Medication Sig Dispense Refill   aspirin 81 MG chewable tablet Chew 81 mg by mouth daily.     valACYclovir (VALTREX) 1000 MG tablet TAKE 1 TABLET TWICE A DAY 20 tablet 1   indapamide (LOZOL) 2.5 MG tablet Take 1 tablet (2.5 mg total) by mouth daily. 90 tablet 0   potassium chloride SA (KLOR-CON M20) 20 MEQ tablet Take 1 tablet (20 mEq total) by mouth 2 (two) times daily. 180 tablet 0   tadalafil (CIALIS) 20 MG tablet TAKE 1 TABLET DAILY AS NEEDED. (GENERIC EQUIVALENT FOR CIALIS) 12 tablet 1   Testosterone 20.25 MG/ACT (1.62%) GEL Place 2 Pump onto the skin daily. 225 g 1   valsartan (DIOVAN) 320 MG tablet Take 1 tablet (320 mg total) by mouth daily. 90 tablet 0   meclizine (ANTIVERT) 25 MG tablet Take 1 tablet (25 mg total) by mouth 3 (three) times daily as needed for dizziness. 30 tablet 0   montelukast (SINGULAIR) 10 MG tablet Take 1 tablet (10 mg total) by mouth at bedtime. 30 tablet 0   No facility-administered medications prior to visit.    ROS Review of Systems  Constitutional: Negative.  Negative for diaphoresis and fatigue.  HENT: Negative.    Eyes: Negative.   Respiratory: Negative.  Negative for cough, chest tightness, shortness of breath and wheezing.   Cardiovascular:  Negative for chest pain, palpitations and leg swelling.  Gastrointestinal:  Negative for abdominal pain, constipation, diarrhea, nausea and vomiting.  Endocrine: Negative.   Genitourinary: Negative.   Musculoskeletal: Negative.  Negative for arthralgias and myalgias.  Skin: Negative.   Neurological: Negative.  Negative for dizziness and weakness.  Hematological:  Negative for adenopathy. Does not bruise/bleed  easily.  Psychiatric/Behavioral: Negative.      Objective:  BP 136/88 (BP Location: Right Arm, Patient Position: Sitting, Cuff Size: Large)   Pulse 64   Temp 97.9 F (36.6 C) (Oral)   Resp 16   Ht 5\' 9"  (1.753 m)   Wt 205 lb (93 kg)   SpO2 95%   BMI 30.27 kg/m   BP Readings from Last 3 Encounters:  09/19/22 136/88  07/17/22 (!) 146/100  03/14/22 134/88    Wt Readings from Last 3 Encounters:  09/19/22 205 lb (93 kg)  07/17/22 204 lb 12.9 oz (92.9 kg)  03/14/22 203 lb (92.1 kg)    Physical Exam Vitals reviewed.  HENT:     Nose: Nose normal.     Mouth/Throat:     Mouth: Mucous membranes are moist.  Eyes:     General: No scleral icterus.    Conjunctiva/sclera: Conjunctivae normal.  Cardiovascular:     Rate and Rhythm: Normal rate and regular rhythm.     Heart sounds: No murmur heard.    No gallop.  Pulmonary:     Effort: Pulmonary effort is normal.     Breath sounds: No stridor. No wheezing, rhonchi or rales.  Abdominal:     Palpations: There is no mass.     Tenderness: There is no abdominal tenderness. There is no guarding.     Hernia: No hernia is present.  Musculoskeletal:        General:  Normal range of motion.     Cervical back: Neck supple.     Right lower leg: No edema.     Left lower leg: No edema.  Lymphadenopathy:     Cervical: No cervical adenopathy.  Skin:    General: Skin is warm and dry.  Neurological:     General: No focal deficit present.     Mental Status: Lawrence Smith is alert. Mental status is at baseline.  Psychiatric:        Mood and Affect: Mood normal.        Behavior: Behavior normal.     Lab Results  Component Value Date   WBC 7.0 09/19/2022   HGB 16.5 09/19/2022   HCT 48.1 09/19/2022   PLT 265.0 09/19/2022   GLUCOSE 84 09/19/2022   CHOL 212 (H) 09/19/2022   TRIG 92.0 09/19/2022   HDL 47.70 09/19/2022   LDLDIRECT 153.0 03/14/2022   LDLCALC 146 (H) 09/19/2022   ALT 29 07/17/2022   AST 25 07/17/2022   NA 139 09/19/2022   K 3.9  09/19/2022   CL 101 09/19/2022   CREATININE 0.90 09/19/2022   BUN 22 09/19/2022   CO2 31 09/19/2022   TSH 1.87 03/14/2022   PSA 0.42 03/14/2022   HGBA1C 5.9 09/19/2022    MR BRAIN/IAC W WO CONTRAST  Result Date: 07/17/2022 CLINICAL DATA:  Dizzinesses.  Chronic left ear pain. EXAM: MRI HEAD WITHOUT AND WITH CONTRAST TECHNIQUE: Multiplanar, multiecho pulse sequences of the brain and surrounding structures were obtained without and with intravenous contrast. CONTRAST:  10mL GADAVIST GADOBUTROL 1 MMOL/ML IV SOLN COMPARISON:  None Available. FINDINGS: Brain: No acute infarction, hemorrhage, hydrocephalus, extra-axial collection or mass lesion. Dedicated IAC protocol was performed. The visualized seventh and eighth cranial nerves are unremarkable. No pathologic enhancement or visible retrocochlear mass. Normal CSF signal within the labyrinth bilaterally. No mastoid effusions. Vascular: Major arterial flow voids are maintained at the skull base. Skull and upper cervical spine: Normal marrow signal. Sinuses/Orbits: Mild paranasal sinus mucosal thickening. No acute orbital findings. IMPRESSION: No evidence of acute intracranial abnormality or retrocochlear mass. Electronically Signed   By: Feliberto Harts M.D.   On: 07/17/2022 11:03   MR Cervical Spine Wo Contrast  Result Date: 07/17/2022 CLINICAL DATA:  Cervical radiculopathy, no red flags EXAM: MRI CERVICAL SPINE WITHOUT CONTRAST TECHNIQUE: Multiplanar, multisequence MR imaging of the cervical spine was performed. No intravenous contrast was administered. COMPARISON:  None Available. FINDINGS: Alignment: Straightening of the cervical lordosis without static listhesis. Vertebrae: No fracture, evidence of discitis, or bone lesion. Cord: Normal signal and morphology. Posterior Fossa, vertebral arteries, paraspinal tissues: Negative. Disc levels: C2-C3: No disc protrusion. Mild right-sided facet arthropathy and minimal uncovertebral spurring. Mild right  foraminal stenosis. No canal stenosis. C3-C4: Disc osteophyte complex with prominent right uncovertebral spurring and right greater than left facet arthropathy. Moderate to severe right and mild left foraminal stenosis. No significant canal stenosis. C4-C5: No disc protrusion. Mild right greater than left facet arthropathy as well as right uncovertebral spurring. Mild-moderate right foraminal stenosis. No significant canal stenosis. C5-C6: Disc osteophyte complex, eccentric to the right with right-sided uncovertebral spurring. Minimal facet hypertrophy. Moderate right foraminal stenosis and mild canal stenosis. C6-C7: Right paracentral disc protrusion resulting in mild impress upon the right hemicord. Right greater than left uncovertebral spurring. Mild canal stenosis with moderate right foraminal stenosis. C7-T1: No disc protrusion. Minimal right uncovertebral spurring and mild facet hypertrophy. Mild right foraminal stenosis. No canal stenosis. IMPRESSION: 1. Multilevel cervical spondylosis, most  pronounced at the C3-4 level where there is moderate-to-severe right and mild left foraminal stenosis. 2. Mild canal stenosis and moderate right foraminal stenosis at C5-6 and C6-7. Electronically Signed   By: Duanne Guess D.O.   On: 07/17/2022 10:53    Assessment & Plan:   Chronic hyperglycemia -     Hemoglobin A1c; Future  Diuretic-induced hypokalemia -     Magnesium; Future -     Basic metabolic panel; Future -     Potassium Chloride Crys ER; Take 1 tablet (20 mEq total) by mouth 2 (two) times daily.  Dispense: 180 tablet; Refill: 1  Essential hypertension- Lawrence Smith blood pressure is well-controlled.  Electrolytes and renal function are normal. -     CBC with Differential/Platelet; Future -     CT CARDIAC SCORING (SELF PAY ONLY); Future -     Indapamide; Take 1 tablet (2.5 mg total) by mouth daily.  Dispense: 90 tablet; Refill: 1 -     Potassium Chloride Crys ER; Take 1 tablet (20 mEq total) by mouth 2  (two) times daily.  Dispense: 180 tablet; Refill: 1 -     Valsartan; Take 1 tablet (320 mg total) by mouth daily.  Dispense: 90 tablet; Refill: 1  Hypogonadism male -     CBC with Differential/Platelet; Future -     Testosterone Total,Free,Bio, Males; Future  Hyperlipidemia LDL goal <130- Lawrence Smith has not achieved Lawrence Smith LDL goal.  I recommended that Lawrence Smith undergo a coronary calcium score and if needed will add to the statin to get Lawrence Smith LDL less than 100. -     Lipid panel; Future -     CT CARDIAC SCORING (SELF PAY ONLY); Future  Nonspecific abnormal electrocardiogram (ECG) (EKG) -     CT CARDIAC SCORING (SELF PAY ONLY); Future  Hypogonadism in male -     Testosterone; Place 2 Pump onto the skin daily.  Dispense: 225 g; Refill: 1  Drug-induced erectile dysfunction -     Tadalafil; TAKE 1 TABLET DAILY AS NEEDED. (GENERIC EQUIVALENT FOR CIALIS)  Dispense: 12 tablet; Refill: 1  Other orders -     Varicella-zoster vaccine IM     Follow-up: Return in about 6 months (around 03/21/2023).  Sanda Linger, MD

## 2022-09-20 ENCOUNTER — Other Ambulatory Visit: Payer: Self-pay | Admitting: Internal Medicine

## 2022-09-20 DIAGNOSIS — I10 Essential (primary) hypertension: Secondary | ICD-10-CM

## 2022-09-20 DIAGNOSIS — E876 Hypokalemia: Secondary | ICD-10-CM

## 2022-09-20 LAB — TESTOSTERONE TOTAL,FREE,BIO, MALES
Albumin: 4.2 g/dL (ref 3.6–5.1)
Sex Hormone Binding: 33 nmol/L (ref 22–77)
Testosterone, Bioavailable: 82.3 ng/dL — ABNORMAL LOW (ref 110.0–575.0)
Testosterone, Free: 42.7 pg/mL — ABNORMAL LOW (ref 46.0–224.0)
Testosterone: 328 ng/dL (ref 250–827)

## 2022-10-30 ENCOUNTER — Other Ambulatory Visit: Payer: BC Managed Care – PPO

## 2022-11-01 ENCOUNTER — Other Ambulatory Visit: Payer: Self-pay

## 2022-11-01 ENCOUNTER — Other Ambulatory Visit: Payer: Self-pay | Admitting: Internal Medicine

## 2022-11-01 ENCOUNTER — Other Ambulatory Visit (HOSPITAL_COMMUNITY): Payer: Self-pay

## 2022-11-01 ENCOUNTER — Encounter: Payer: Self-pay | Admitting: Internal Medicine

## 2022-11-01 DIAGNOSIS — B001 Herpesviral vesicular dermatitis: Secondary | ICD-10-CM

## 2022-11-01 DIAGNOSIS — I10 Essential (primary) hypertension: Secondary | ICD-10-CM

## 2022-11-01 MED ORDER — VALSARTAN 320 MG PO TABS
320.0000 mg | ORAL_TABLET | Freq: Every day | ORAL | 1 refills | Status: DC
Start: 2022-11-01 — End: 2023-07-07
  Filled 2022-11-01: qty 90, 90d supply, fill #0
  Filled 2023-02-04: qty 90, 90d supply, fill #1

## 2022-11-22 ENCOUNTER — Ambulatory Visit
Admission: RE | Admit: 2022-11-22 | Discharge: 2022-11-22 | Disposition: A | Discharge status: Home / Self Care | Payer: No Typology Code available for payment source | Source: Ambulatory Visit | Attending: Internal Medicine | Admitting: Internal Medicine

## 2022-11-22 ENCOUNTER — Other Ambulatory Visit: Payer: Self-pay | Admitting: Internal Medicine

## 2022-11-22 DIAGNOSIS — R9431 Abnormal electrocardiogram [ECG] [EKG]: Secondary | ICD-10-CM

## 2022-11-22 DIAGNOSIS — R931 Abnormal findings on diagnostic imaging of heart and coronary circulation: Secondary | ICD-10-CM

## 2022-11-22 DIAGNOSIS — I10 Essential (primary) hypertension: Secondary | ICD-10-CM

## 2022-11-22 DIAGNOSIS — E785 Hyperlipidemia, unspecified: Secondary | ICD-10-CM

## 2022-11-22 MED ORDER — ASPIRIN 81 MG PO CHEW
81.0000 mg | CHEWABLE_TABLET | Freq: Every day | ORAL | 1 refills | Status: AC
Start: 2022-11-22 — End: ?

## 2022-11-22 MED ORDER — ROSUVASTATIN CALCIUM 20 MG PO TABS
20.0000 mg | ORAL_TABLET | Freq: Every day | ORAL | 1 refills | Status: DC
Start: 2022-11-22 — End: 2022-12-12

## 2022-12-12 ENCOUNTER — Encounter: Payer: Self-pay | Admitting: Internal Medicine

## 2022-12-12 DIAGNOSIS — E785 Hyperlipidemia, unspecified: Secondary | ICD-10-CM

## 2022-12-12 DIAGNOSIS — R931 Abnormal findings on diagnostic imaging of heart and coronary circulation: Secondary | ICD-10-CM

## 2022-12-12 MED ORDER — ROSUVASTATIN CALCIUM 20 MG PO TABS: 20.0000 mg | ORAL_TABLET | Freq: Every day | ORAL | 1 refills | Status: DC

## 2023-02-05 ENCOUNTER — Ambulatory Visit: Payer: BC Managed Care – PPO | Admitting: Internal Medicine

## 2023-02-05 ENCOUNTER — Other Ambulatory Visit (HOSPITAL_COMMUNITY): Payer: Self-pay

## 2023-02-10 ENCOUNTER — Ambulatory Visit: Payer: BC Managed Care – PPO | Admitting: Internal Medicine

## 2023-03-25 ENCOUNTER — Encounter: Payer: Self-pay | Admitting: Internal Medicine

## 2023-03-26 ENCOUNTER — Ambulatory Visit: Payer: BC Managed Care – PPO | Admitting: Internal Medicine

## 2023-04-23 ENCOUNTER — Ambulatory Visit: Payer: BC Managed Care – PPO | Admitting: Internal Medicine

## 2023-04-23 ENCOUNTER — Encounter: Payer: Self-pay | Admitting: Internal Medicine

## 2023-04-23 VITALS — BP 142/94 | HR 73 | Temp 97.8°F | Resp 16 | Ht 69.0 in | Wt 208.2 lb

## 2023-04-23 DIAGNOSIS — Z0000 Encounter for general adult medical examination without abnormal findings: Secondary | ICD-10-CM | POA: Diagnosis not present

## 2023-04-23 DIAGNOSIS — Z1211 Encounter for screening for malignant neoplasm of colon: Secondary | ICD-10-CM

## 2023-04-23 DIAGNOSIS — E785 Hyperlipidemia, unspecified: Secondary | ICD-10-CM

## 2023-04-23 DIAGNOSIS — R739 Hyperglycemia, unspecified: Secondary | ICD-10-CM | POA: Diagnosis not present

## 2023-04-23 DIAGNOSIS — I10 Essential (primary) hypertension: Secondary | ICD-10-CM

## 2023-04-23 DIAGNOSIS — Z23 Encounter for immunization: Secondary | ICD-10-CM | POA: Diagnosis not present

## 2023-04-23 DIAGNOSIS — E291 Testicular hypofunction: Secondary | ICD-10-CM | POA: Diagnosis not present

## 2023-04-23 LAB — BASIC METABOLIC PANEL
BUN: 20 mg/dL (ref 6–23)
CO2: 31 meq/L (ref 19–32)
Calcium: 9.4 mg/dL (ref 8.4–10.5)
Chloride: 102 meq/L (ref 96–112)
Creatinine, Ser: 0.85 mg/dL (ref 0.40–1.50)
GFR: 94.54 mL/min (ref >60.00)
Glucose, Bld: 100 mg/dL — ABNORMAL HIGH (ref 70–99)
Potassium: 4 meq/L (ref 3.5–5.1)
Sodium: 140 meq/L (ref 135–145)

## 2023-04-23 LAB — URINALYSIS, ROUTINE W REFLEX MICROSCOPIC
Bilirubin Urine: NEGATIVE
Hgb urine dipstick: NEGATIVE
Ketones, ur: NEGATIVE
Leukocytes,Ua: NEGATIVE
Nitrite: NEGATIVE
RBC / HPF: NONE SEEN (ref 0–?)
Specific Gravity, Urine: 1.02 (ref 1.000–1.030)
Total Protein, Urine: NEGATIVE
Urine Glucose: NEGATIVE
Urobilinogen, UA: 0.2 (ref 0.0–1.0)
pH: 7 (ref 5.0–8.0)

## 2023-04-23 LAB — HEPATIC FUNCTION PANEL
ALT: 53 U/L (ref 0–53)
AST: 27 U/L (ref 0–37)
Albumin: 4.2 g/dL (ref 3.5–5.2)
Alkaline Phosphatase: 54 U/L (ref 39–117)
Bilirubin, Direct: 0.2 mg/dL (ref 0.0–0.3)
Total Bilirubin: 0.9 mg/dL (ref 0.2–1.2)
Total Protein: 6.8 g/dL (ref 6.0–8.3)

## 2023-04-23 LAB — CK: Total CK: 84 U/L (ref 7–232)

## 2023-04-23 LAB — LIPID PANEL
Cholesterol: 157 mg/dL (ref 0–200)
HDL: 53.2 mg/dL (ref >39.00)
LDL Cholesterol: 86 mg/dL (ref 0–99)
NonHDL: 103.46
Total CHOL/HDL Ratio: 3
Triglycerides: 89 mg/dL (ref 0.0–149.0)
VLDL: 17.8 mg/dL (ref 0.0–40.0)

## 2023-04-23 LAB — PSA: PSA: 0.73 ng/mL (ref 0.10–4.00)

## 2023-04-23 LAB — HEMOGLOBIN A1C: Hgb A1c MFr Bld: 5.8 % (ref 4.6–6.5)

## 2023-04-23 NOTE — Patient Instructions (Signed)
Health Maintenance, Male Adopting a healthy lifestyle and getting preventive care are important in promoting health and wellness. Ask your health care provider about: The right schedule for you to have regular tests and exams. Things you can do on your own to prevent diseases and keep yourself healthy. What should I know about diet, weight, and exercise? Eat a healthy diet  Eat a diet that includes plenty of vegetables, fruits, low-fat dairy products, and lean protein. Do not eat a lot of foods that are high in solid fats, added sugars, or sodium. Maintain a healthy weight Body mass index (BMI) is a measurement that can be used to identify possible weight problems. It estimates body fat based on height and weight. Your health care provider can help determine your BMI and help you achieve or maintain a healthy weight. Get regular exercise Get regular exercise. This is one of the most important things you can do for your health. Most adults should: Exercise for at least 150 minutes each week. The exercise should increase your heart rate and make you sweat (moderate-intensity exercise). Do strengthening exercises at least twice a week. This is in addition to the moderate-intensity exercise. Spend less time sitting. Even light physical activity can be beneficial. Watch cholesterol and blood lipids Have your blood tested for lipids and cholesterol at 60 years of age, then have this test every 5 years. You may need to have your cholesterol levels checked more often if: Your lipid or cholesterol levels are high. You are older than 60 years of age. You are at high risk for heart disease. What should I know about cancer screening? Many types of cancers can be detected early and may often be prevented. Depending on your health history and family history, you may need to have cancer screening at various ages. This may include screening for: Colorectal cancer. Prostate cancer. Skin cancer. Lung  cancer. What should I know about heart disease, diabetes, and high blood pressure? Blood pressure and heart disease High blood pressure causes heart disease and increases the risk of stroke. This is more likely to develop in people who have high blood pressure readings or are overweight. Talk with your health care provider about your target blood pressure readings. Have your blood pressure checked: Every 3-5 years if you are 18-39 years of age. Every year if you are 40 years old or older. If you are between the ages of 65 and 75 and are a current or former smoker, ask your health care provider if you should have a one-time screening for abdominal aortic aneurysm (AAA). Diabetes Have regular diabetes screenings. This checks your fasting blood sugar level. Have the screening done: Once every three years after age 45 if you are at a normal weight and have a low risk for diabetes. More often and at a younger age if you are overweight or have a high risk for diabetes. What should I know about preventing infection? Hepatitis B If you have a higher risk for hepatitis B, you should be screened for this virus. Talk with your health care provider to find out if you are at risk for hepatitis B infection. Hepatitis C Blood testing is recommended for: Everyone born from 1945 through 1965. Anyone with known risk factors for hepatitis C. Sexually transmitted infections (STIs) You should be screened each year for STIs, including gonorrhea and chlamydia, if: You are sexually active and are younger than 60 years of age. You are older than 60 years of age and your   health care provider tells you that you are at risk for this type of infection. Your sexual activity has changed since you were last screened, and you are at increased risk for chlamydia or gonorrhea. Ask your health care provider if you are at risk. Ask your health care provider about whether you are at high risk for HIV. Your health care provider  may recommend a prescription medicine to help prevent HIV infection. If you choose to take medicine to prevent HIV, you should first get tested for HIV. You should then be tested every 3 months for as long as you are taking the medicine. Follow these instructions at home: Alcohol use Do not drink alcohol if your health care provider tells you not to drink. If you drink alcohol: Limit how much you have to 0-2 drinks a day. Know how much alcohol is in your drink. In the [place] one drink equals one 12 oz bottle of beer (355 mL), one 5 oz glass of wine (148 mL), or one 1 oz glass of hard liquor (44 mL). Lifestyle Do not use any products that contain nicotine or tobacco. These products include cigarettes, chewing tobacco, and vaping devices, such as e-cigarettes. If you need help quitting, ask your health care provider. Do not use street drugs. Do not share needles. Ask your health care provider for help if you need support or information about quitting drugs. General instructions Schedule regular health, dental, and eye exams. Stay current with your vaccines. Tell your health care provider if: You often feel depressed. You have ever been abused or do not feel safe at home. Summary Adopting a healthy lifestyle and getting preventive care are important in promoting health and wellness. Follow your health care provider's instructions about healthy diet, exercising, and getting tested or screened for diseases. Follow your health care provider's instructions on monitoring your cholesterol and blood pressure. This information is not intended to replace advice given to you by your health care provider. Make sure you discuss any questions you have with your health care provider. Document Revised: 10/02/2020 Document Reviewed: 10/02/2020 Elsevier Patient Education  2024 [employer]  

## 2023-04-23 NOTE — Progress Notes (Signed)
Subjective:  Patient ID: Lawrence Smith, male    DOB: [birth year]  Age: 62 y.o. MRN: [id number]  CC: Annual Exam, Hypertension, and Hyperlipidemia   HPI Lawrence Smith presents for a CPX and f/up ---  Discussed the use of AI scribe software for clinical note transcription with the patient, who gave verbal consent to proceed.  History of Present Illness   The patient, with a history of hypertension, reports a change in job two and a half years ago that has led to a more sedentary lifestyle due to increased travel. Despite this, he has been intentional about maintaining some level of physical activity. He recently spent ten days in [place] where he walked two miles daily with his wife, without experiencing any chest pain, shortness of breath, cold sweats, or blood pressure symptoms such as headache or blurred vision.  He reports no side effects such as muscle or joint aches.   The patient also reports nocturia, waking up once or twice a night to urinate, which he attributes to the medication he is on. He denies any straining during urination during the day. He had a calcium score done over the summer which was slightly elevated, placing him in the seventy-seventh percentile for his age group. He has an upcoming appointment with a cardiologist.       Outpatient Medications Prior to Visit  Medication Sig Dispense Refill   aspirin 81 MG chewable tablet Chew 1 tablet (81 mg total) by mouth daily. 90 tablet 1   indapamide (LOZOL) 2.5 MG tablet Take 1 tablet (2.5 mg total) by mouth daily. 90 tablet 1   potassium chloride SA (KLOR-CON M20) 20 MEQ tablet Take 1 tablet (20 mEq total) by mouth 2 (two) times daily. 180 tablet 1   rosuvastatin (CRESTOR) 20 MG tablet Take 1 tablet (20 mg total) by mouth daily. 90 tablet 1   tadalafil (CIALIS) 20 MG tablet TAKE 1 TABLET DAILY AS NEEDED. (GENERIC EQUIVALENT FOR CIALIS) 12 tablet 1   Testosterone 20.25 MG/ACT (1.62%) GEL Place 2 Pump onto the skin daily. 225  g 1   valACYclovir (VALTREX) 1000 MG tablet TAKE 1 TABLET TWICE A DAY (Patient taking differently: Take 1,000 mg by mouth as needed.) 20 tablet 3   valsartan (DIOVAN) 320 MG tablet Take 1 tablet (320 mg total) by mouth daily. 90 tablet 1   No facility-administered medications prior to visit.    ROS Review of Systems  Objective:  BP (!) 142/94 (BP Location: Left Arm, Patient Position: Sitting, Cuff Size: Normal)   Pulse 73   Temp 97.8 F (36.6 C) (Oral)   Resp 16   Ht 5\' 9"  (1.753 m)   Wt 208 lb 3.2 oz (94.4 kg)   SpO2 95%   BMI 30.75 kg/m   BP Readings from Last 3 Encounters:  04/23/23 (!) 142/94  09/19/22 136/88  07/17/22 (!) 146/100    Wt Readings from Last 3 Encounters:  04/23/23 208 lb 3.2 oz (94.4 kg)  09/19/22 205 lb (93 kg)  07/17/22 204 lb 12.9 oz (92.9 kg)    Physical Exam Cardiovascular:     Rate and Rhythm: Normal rate and regular rhythm.     Heart sounds: Normal heart sounds, S1 normal and S2 normal. No murmur heard.    Comments: EKG- NSR with SA, 64 bpm No LVH, Q waves, or ST/T waves  Abdominal:     Hernia: There is no hernia in the left inguinal area or right inguinal area.  Genitourinary:    Pubic Area: No rash.      Penis: Normal and circumcised.      Testes: Normal.     Epididymis:     Right: Normal.     Left: Normal.     Prostate: Not enlarged, not tender and no nodules present.     Rectum: Normal. Guaiac result negative. No mass, tenderness, anal fissure, external hemorrhoid or internal hemorrhoid. Normal anal tone.  Musculoskeletal:     Right lower leg: No edema.     Left lower leg: No edema.  Lymphadenopathy:     Lower Body: No right inguinal adenopathy. No left inguinal adenopathy.     Lab Results  Component Value Date   WBC 7.0 09/19/2022   HGB 16.5 09/19/2022   HCT 48.1 09/19/2022   PLT 265.0 09/19/2022   GLUCOSE 100 (H) 04/23/2023   CHOL 157 04/23/2023   TRIG 89.0 04/23/2023   HDL 53.20 04/23/2023   LDLDIRECT 153.0  03/14/2022   LDLCALC 86 04/23/2023   ALT 53 04/23/2023   AST 27 04/23/2023   NA 140 04/23/2023   K 4.0 04/23/2023   CL 102 04/23/2023   CREATININE 0.85 04/23/2023   BUN 20 04/23/2023   CO2 31 04/23/2023   TSH 1.87 03/14/2022   PSA 0.73 04/23/2023   HGBA1C 5.8 04/23/2023    CT CARDIAC SCORING ([facility] LOCATIONS ONLY)  Result Date: 11/22/2022 CLINICAL DATA:  62 year old Caucasian male with history of hyperlipidemia and family history of coronary artery disease. * Tracking Code: FCC * EXAM: CT CARDIAC CORONARY ARTERY CALCIUM SCORE TECHNIQUE: Non-contrast imaging through the heart was performed using prospective ECG gating. Image post processing was performed on an independent workstation, allowing for quantitative analysis of the heart and coronary arteries. Note that this exam targets the heart and the chest was not imaged in its entirety. COMPARISON:  No priors. FINDINGS: CORONARY CALCIUM SCORES: Left Main: 0 LAD: 132 LCx: 8 RCA: 15 Total Agatston Score: 155 MESA database percentile: 77th AORTA MEASUREMENTS: Ascending Aorta: 3.7 cm Descending Aorta:2.9 cm OTHER FINDINGS: Atherosclerotic calcifications in the thoracic aorta. Within the visualized portions of the thorax there are no suspicious appearing pulmonary nodules or masses, there is no acute consolidative airspace disease, no pleural effusions, no pneumothorax and no lymphadenopathy. Visualized portions of the upper abdomen are unremarkable. There are no aggressive appearing lytic or blastic lesions noted in the visualized portions of the skeleton. IMPRESSION: 1. Patient's total coronary artery calcium score is 155 which is 77th percentile for patient's of matched age, gender and race/ethnicity. Please note that although the presence of coronary artery calcium documents the presence of coronary artery disease, Lawrence  Smith M.D.   On: 11/22/2022 09:03    Assessment & Plan:   Hypogonadism in male -     Urinalysis, Routine w reflex  microscopic; Future -     Testosterone Total,Free,Bio, Males; Future  Chronic hyperglycemia -     Basic metabolic panel; Future -     Hemoglobin A1c; Future  Hyperlipidemia LDL goal <130 - LDL goal achieved. Doing well on the statin  -     Hepatic function panel; Future -     Lipid panel; Future -     CK; Future  Routine general medical examination at a health care facility - Exam completed, labs reviewed, vaccines reviewed and updated, cancer screenings addressed, pt ed material was given.  -     PSA; Future  Need for immunization against influenza -  Flu vaccine trivalent PF, 6mos and older(Flulaval,Afluria,Fluarix,Fluzone)  Essential hypertension- EKG is negative for LVH. He has not achieved his BP goal. Will evaluate for secondary causes. -     EKG 12-Lead -     Aldosterone + renin activity w/ ratio; Future  Screening for colon cancer -     Cologuard     Follow-up: Return in about 6 months (around 10/21/2023).  Sanda Linger, MD

## 2023-04-29 ENCOUNTER — Encounter: Payer: Self-pay | Admitting: Internal Medicine

## 2023-04-29 ENCOUNTER — Ambulatory Visit: Payer: BC Managed Care – PPO | Attending: Internal Medicine | Admitting: Internal Medicine

## 2023-04-29 ENCOUNTER — Other Ambulatory Visit: Payer: Self-pay

## 2023-04-29 VITALS — BP 148/98 | HR 68 | Ht 69.0 in | Wt 211.0 lb

## 2023-04-29 DIAGNOSIS — E785 Hyperlipidemia, unspecified: Secondary | ICD-10-CM

## 2023-04-29 DIAGNOSIS — R739 Hyperglycemia, unspecified: Secondary | ICD-10-CM

## 2023-04-29 DIAGNOSIS — I10 Essential (primary) hypertension: Secondary | ICD-10-CM

## 2023-04-29 NOTE — Progress Notes (Signed)
Cardiology Office Note   Date:  04/29/2023   ID:  STELIOS MCGHIE, DOB 06-27-1960, MRN [id number]  PCP:  Etta Grandchild, MD  Cardiologist:   Dietrich Pates, MD       History of Present Illness: Lawrence Smith is a 62 y.o. 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   indapamide (LOZOL) 2.5 MG tablet Take 1 tablet (2.5 mg total) by mouth daily.   potassium chloride SA (KLOR-CON M20) 20 MEQ tablet Take 1 tablet (20 mEq total) by mouth 2 (two) times daily.   rosuvastatin (CRESTOR) 20 MG tablet Take 1 tablet (20 mg total) by mouth daily.   tadalafil (CIALIS) 20 MG tablet TAKE 1 TABLET DAILY AS NEEDED. (GENERIC EQUIVALENT FOR CIALIS)   Testosterone 20.25 MG/ACT (1.62%) GEL Place 2 Pump onto the skin daily.   valACYclovir (VALTREX) 1000 MG tablet TAKE 1 TABLET TWICE A DAY   valsartan (DIOVAN) 320 MG tablet Take 1 tablet (320 mg total) by mouth daily.     Allergies:   Penicillins   Past Medical History:  Diagnosis Date   Allergy    allergy shots as child   Asthma    as child   Atypical mole 05/08/2018   Mild atypia on left lower back   Atypical mole 05/08/2018   Moderate atypia on right side   Basal cell carcinoma 05/08/2018   Left upper arm - CX3+5FU   Basal cell carcinoma of skin 09/10/2019   Pigmented on right parietal scalp cx3,78fu   History of nephrolithiasis    Hypertension    Melanoma (HCC) 12/15/2017   Level II on mid upper back - excision     Past Surgical History:  Procedure Laterality Date   DENTAL SURGERY       Social History:  The patient  reports that he has never smoked. He has never used smokeless tobacco. He reports current alcohol use of about 2.0 standard drinks of alcohol per week. He reports that he does not use drugs.   Family History:  The patient's family history includes Arthritis in an other family member; Diabetes in an other family member; Heart disease in his father; Hypertension in his mother, sister, and another family member.    ROS:  Please see the history of present illness. All other systems are reviewed and  Negative to the above problem except as noted.    PHYSICAL EXAM: VS:  BP (!) 148/98   Pulse 68   Ht 5\' 9"  (1.753 m)   Wt 211 lb (95.7 kg)   SpO2 98%   BMI 31.16 kg/m   (Pt says always high at MD office  Better outside)  GEN: Well nourished, well developed, in no acute distress  HEENT: normal  Neck: no JVD,  carotid bruit Cardiac: RRR; no murmur  No LE  edema  Respiratory:  clear to auscultation bilaterally GI: soft, nontender, No hepatomegaly  M EKG:  EKG is ordered today.   Lipid Panel    Component Value Date/Time   CHOL 157 04/23/2023 0826   TRIG 89.0 04/23/2023 0826   HDL 53.20 04/23/2023 0826   CHOLHDL 3 04/23/2023 0826   VLDL 17.8 04/23/2023 0826   LDLCALC 86 04/23/2023 0826   LDLDIRECT 153.0 03/14/2022 1552      Wt Readings from Last 3 Encounters:  04/29/23 211 lb (95.7 kg)  04/23/23 208 lb 3.2 oz (94.4 kg)  09/19/22 205 lb (93 kg)      ASSESSMENT AND PLAN:  1  CAD  Pt with recent Ca score CT    Score 155   No symptoms of angina     Rx risk factors  2  Lipids   Pt just started on Crestor this fall   LIpids are not optimal  Before adjusting meds would recomm working on diet  (discussed whole, minimlly processed foodsd)  Will need lipomed, Lpa, Apo B later this winter    3  HTN  BP is elevated  Needs to check at home   Have cuff calibrated   Adjust as  needed   Increase aerobic activity   Watch diet        Current medicines are reviewed at length with the patient today.  The patient does not have concerns regarding medicines.  Signed, Dietrich Pates, MD  04/29/2023 9:29 AM    [facility] [address] [place], [place], [place]  [address] Phone: [phone]; Fax: [phone]

## 2023-04-29 NOTE — Patient Instructions (Signed)
Medication Instructions:   *If you need a refill on your cardiac medications before your next appointment, please call your pharmacy*   Lab Work: IN 4 MONTHS YOU CAN WALK IN FOR AN NMR, APO B, LIPO A, HGBA1C... ANY [facility] OR OUR OFFICE  If you have labs (blood work) drawn today and your tests are completely normal, you will receive your results only by: MyChart Message (if you have MyChart) OR A paper copy in the mail If you have any lab test that is abnormal or we need to change your treatment, we will call you to review the results.   Testing/Procedures:  BRING BP LOG TO DROP OFF AT YOU LAB VISIT   Follow-Up: At [facility], you and your health needs are our priority.  As part of our continuing mission to provide you with exceptional heart care, we have created designated Provider Care Teams.  These Care Teams include your primary Cardiologist (physician) and Advanced Practice Providers (APPs -  Physician Assistants and Nurse Practitioners) who all work together to provide you with the care you need, when you need it.  We recommend signing up for the patient portal called "MyChart".  Sign up information is provided on this After Visit Summary.  MyChart is used to connect with patients for Virtual Visits (Telemedicine).  Patients are able to view lab/test results, encounter notes, upcoming appointments, etc.  Non-urgent messages can be sent to your provider as well.   To learn more about what you can do with MyChart, go to [email or web address].

## 2023-04-30 LAB — ALDOSTERONE + RENIN ACTIVITY W/ RATIO
ALDO / PRA Ratio: 3.1 {ratio} (ref 0.9–28.9)
Aldosterone: 6 ng/dL
Renin Activity: 1.93 ng/mL/h (ref 0.25–5.82)

## 2023-04-30 LAB — TESTOSTERONE TOTAL,FREE,BIO, MALES
Albumin: 4.3 g/dL (ref 3.6–5.1)
Sex Hormone Binding: 30 nmol/L (ref 22–77)
Testosterone, Bioavailable: 163.1 ng/dL (ref 110.0–575.0)
Testosterone, Free: 82.8 pg/mL (ref 46.0–224.0)
Testosterone: 549 ng/dL (ref 250–827)

## 2023-05-19 ENCOUNTER — Other Ambulatory Visit: Payer: Self-pay | Admitting: Internal Medicine

## 2023-05-19 DIAGNOSIS — I10 Essential (primary) hypertension: Secondary | ICD-10-CM

## 2023-05-22 ENCOUNTER — Other Ambulatory Visit: Payer: Self-pay

## 2023-05-22 ENCOUNTER — Encounter: Payer: Self-pay | Admitting: Internal Medicine

## 2023-05-22 DIAGNOSIS — E291 Testicular hypofunction: Secondary | ICD-10-CM

## 2023-05-22 MED ORDER — TESTOSTERONE 20.25 MG/ACT (1.62%) TD GEL
2.0000 | Freq: Every day | TRANSDERMAL | 1 refills | Status: DC
Start: 2023-05-22 — End: 2023-12-03

## 2023-05-24 DIAGNOSIS — Z1211 Encounter for screening for malignant neoplasm of colon: Secondary | ICD-10-CM | POA: Diagnosis not present

## 2023-05-30 LAB — COLOGUARD: COLOGUARD: NEGATIVE

## 2023-07-07 ENCOUNTER — Other Ambulatory Visit: Payer: Self-pay | Admitting: Internal Medicine

## 2023-07-07 DIAGNOSIS — I10 Essential (primary) hypertension: Secondary | ICD-10-CM

## 2023-07-10 DIAGNOSIS — L905 Scar conditions and fibrosis of skin: Secondary | ICD-10-CM | POA: Diagnosis not present

## 2023-07-10 DIAGNOSIS — Z85820 Personal history of malignant melanoma of skin: Secondary | ICD-10-CM | POA: Diagnosis not present

## 2023-07-10 DIAGNOSIS — D485 Neoplasm of uncertain behavior of skin: Secondary | ICD-10-CM | POA: Diagnosis not present

## 2023-07-10 DIAGNOSIS — D225 Melanocytic nevi of trunk: Secondary | ICD-10-CM | POA: Diagnosis not present

## 2023-07-10 DIAGNOSIS — L821 Other seborrheic keratosis: Secondary | ICD-10-CM | POA: Diagnosis not present

## 2023-08-28 ENCOUNTER — Other Ambulatory Visit (HOSPITAL_COMMUNITY): Payer: Self-pay

## 2023-08-28 DIAGNOSIS — L988 Other specified disorders of the skin and subcutaneous tissue: Secondary | ICD-10-CM | POA: Diagnosis not present

## 2023-08-28 DIAGNOSIS — D485 Neoplasm of uncertain behavior of skin: Secondary | ICD-10-CM | POA: Diagnosis not present

## 2023-08-28 MED ORDER — MUPIROCIN 2 % EX OINT
1.0000 | TOPICAL_OINTMENT | Freq: Every day | CUTANEOUS | 0 refills | Status: AC
  Filled 2023-08-28: qty 22, 15d supply, fill #0

## 2023-09-09 ENCOUNTER — Other Ambulatory Visit (HOSPITAL_COMMUNITY): Payer: Self-pay

## 2023-09-09 ENCOUNTER — Encounter: Payer: Self-pay | Admitting: Internal Medicine

## 2023-09-09 ENCOUNTER — Other Ambulatory Visit: Payer: Self-pay

## 2023-09-09 DIAGNOSIS — H40033 Anatomical narrow angle, bilateral: Secondary | ICD-10-CM | POA: Diagnosis not present

## 2023-09-09 DIAGNOSIS — E876 Hypokalemia: Secondary | ICD-10-CM

## 2023-09-09 DIAGNOSIS — H2513 Age-related nuclear cataract, bilateral: Secondary | ICD-10-CM | POA: Diagnosis not present

## 2023-09-09 DIAGNOSIS — I10 Essential (primary) hypertension: Secondary | ICD-10-CM

## 2023-09-09 MED ORDER — POTASSIUM CHLORIDE CRYS ER 20 MEQ PO TBCR
20.0000 meq | EXTENDED_RELEASE_TABLET | Freq: Two times a day (BID) | ORAL | 0 refills | Status: DC
  Filled 2023-09-09: qty 28, 14d supply, fill #0

## 2023-10-21 ENCOUNTER — Ambulatory Visit: Payer: BC Managed Care – PPO | Admitting: Internal Medicine

## 2023-10-21 ENCOUNTER — Encounter: Payer: Self-pay | Admitting: Internal Medicine

## 2023-10-21 VITALS — BP 132/86 | HR 92 | Temp 97.9°F | Resp 16 | Ht 69.0 in | Wt 191.8 lb

## 2023-10-21 DIAGNOSIS — E291 Testicular hypofunction: Secondary | ICD-10-CM

## 2023-10-21 DIAGNOSIS — E785 Hyperlipidemia, unspecified: Secondary | ICD-10-CM | POA: Diagnosis not present

## 2023-10-21 DIAGNOSIS — I10 Essential (primary) hypertension: Secondary | ICD-10-CM | POA: Diagnosis not present

## 2023-10-21 LAB — CK: Total CK: 94 U/L (ref 7–232)

## 2023-10-21 LAB — CBC WITH DIFFERENTIAL/PLATELET
Basophils Absolute: 0 10*3/uL (ref 0.0–0.1)
Basophils Relative: 0.6 % (ref 0.0–3.0)
Eosinophils Absolute: 0.3 10*3/uL (ref 0.0–0.7)
Eosinophils Relative: 5.3 % — ABNORMAL HIGH (ref 0.0–5.0)
HCT: 45.2 % (ref 39.0–52.0)
Hemoglobin: 15.4 g/dL (ref 13.0–17.0)
Lymphocytes Relative: 29.5 % (ref 12.0–46.0)
Lymphs Abs: 1.4 10*3/uL (ref 0.7–4.0)
MCHC: 34.1 g/dL (ref 30.0–36.0)
MCV: 88.4 fl (ref 78.0–100.0)
Monocytes Absolute: 0.5 10*3/uL (ref 0.1–1.0)
Monocytes Relative: 11.3 % (ref 3.0–12.0)
Neutro Abs: 2.6 10*3/uL (ref 1.4–7.7)
Neutrophils Relative %: 53.3 % (ref 43.0–77.0)
Platelets: 238 10*3/uL (ref 150.0–400.0)
RBC: 5.11 Mil/uL (ref 4.22–5.81)
RDW: 14.4 % (ref 11.5–15.5)
WBC: 4.8 10*3/uL (ref 4.0–10.5)

## 2023-10-21 LAB — LIPID PANEL
Cholesterol: 206 mg/dL — ABNORMAL HIGH (ref 0–200)
HDL: 52.8 mg/dL (ref >39.00)
LDL Cholesterol: 134 mg/dL — ABNORMAL HIGH (ref 0–99)
NonHDL: 153.37
Total CHOL/HDL Ratio: 4
Triglycerides: 96 mg/dL (ref 0.0–149.0)
VLDL: 19.2 mg/dL (ref 0.0–40.0)

## 2023-10-21 LAB — BASIC METABOLIC PANEL WITH GFR
BUN: 20 mg/dL (ref 6–23)
CO2: 28 meq/L (ref 19–32)
Calcium: 9.4 mg/dL (ref 8.4–10.5)
Chloride: 101 meq/L (ref 96–112)
Creatinine, Ser: 0.72 mg/dL (ref 0.40–1.50)
GFR: 99.05 mL/min (ref >60.00)
Glucose, Bld: 84 mg/dL (ref 70–99)
Potassium: 3.6 meq/L (ref 3.5–5.1)
Sodium: 138 meq/L (ref 135–145)

## 2023-10-21 LAB — HEPATIC FUNCTION PANEL
ALT: 21 U/L (ref 0–53)
AST: 19 U/L (ref 0–37)
Albumin: 4.3 g/dL (ref 3.5–5.2)
Alkaline Phosphatase: 42 U/L (ref 39–117)
Bilirubin, Direct: 0.1 mg/dL (ref 0.0–0.3)
Total Bilirubin: 0.7 mg/dL (ref 0.2–1.2)
Total Protein: 7.2 g/dL (ref 6.0–8.3)

## 2023-10-21 LAB — TSH: TSH: 1.93 u[IU]/mL (ref 0.35–5.50)

## 2023-10-21 NOTE — Progress Notes (Unsigned)
 Subjective:  Patient ID: Lawrence Smith, male    DOB: [birth year]  Age: 63 y.o. MRN: [id number]  CC: Hypertension and Hyperlipidemia   HPI Lawrence Smith presents for f/up -----  Discussed the use of AI scribe software for clinical note transcription with the patient, who gave verbal consent to proceed.  History of Present Illness   Lawrence Smith is a 63 year old male who presents for follow-up regarding blood pressure and cholesterol management.  He has been monitoring his blood pressure, noting readings in the teens. No weakness, dizziness, or lightheadedness with these readings, although he occasionally experiences dizziness when standing up quickly, which he attributes to dehydration. This occurs infrequently.  He has made significant lifestyle changes, including improving his exercise routine and diet, resulting in a weight loss of twenty pounds. He has reduced his intake of sugar and processed foods, allowing himself occasional desserts on special occasions.  He discontinued his cholesterol medication due to adverse effects, such as muscle aches and joint pain, which began upon starting the medication. He feels better after stopping the medication, noting that these symptoms were more pronounced than typical age-related aches.  He is currently running and can achieve a heart rate of 140-150 bpm without issues.       Outpatient Medications Prior to Visit  Medication Sig Dispense Refill   aspirin  81 MG chewable tablet Chew 1 tablet (81 mg total) by mouth daily. 90 tablet 1   indapamide  (LOZOL ) 2.5 MG tablet TAKE 1 TABLET DAILY 90 tablet 3   mupirocin  ointment (BACTROBAN ) 2 % Apply 1 Application topically daily. 22 g 0   potassium chloride  SA (KLOR-CON  M20) 20 MEQ tablet Take 1 tablet (20 mEq total) by mouth 2 (two) times daily. 28 tablet 0   tadalafil  (CIALIS ) 20 MG tablet TAKE 1 TABLET DAILY AS NEEDED. (GENERIC EQUIVALENT FOR CIALIS ) 12 tablet 1   Testosterone  20.25 MG/ACT (1.62%)  GEL Place 2 Pump onto the skin daily. 225 g 1   valACYclovir  (VALTREX ) 1000 MG tablet TAKE 1 TABLET TWICE A DAY 20 tablet 3   valsartan  (DIOVAN ) 320 MG tablet TAKE 1 TABLET DAILY 90 tablet 1   rosuvastatin  (CRESTOR ) 20 MG tablet Take 1 tablet (20 mg total) by mouth daily. (Patient not taking: Reported on 10/21/2023) 90 tablet 1   No facility-administered medications prior to visit.    ROS Review of Systems  Objective:  BP 132/86 (BP Location: Left Arm, Patient Position: Sitting, Cuff Size: Normal)   Pulse 92   Temp 97.9 F (36.6 C) (Oral)   Resp 16   Ht 5\' 9"  (1.753 m)   Wt 191 lb 12.8 oz (87 kg)   SpO2 96%   BMI 28.32 kg/m   BP Readings from Last 3 Encounters:  10/21/23 132/86  04/29/23 (!) 148/98  04/23/23 (!) 142/94    Wt Readings from Last 3 Encounters:  10/21/23 191 lb 12.8 oz (87 kg)  04/29/23 211 lb (95.7 kg)  04/23/23 208 lb 3.2 oz (94.4 kg)    Physical Exam  Lab Results  Component Value Date   WBC 7.0 09/19/2022   HGB 16.5 09/19/2022   HCT 48.1 09/19/2022   PLT 265.0 09/19/2022   GLUCOSE 100 (H) 04/23/2023   CHOL 157 04/23/2023   TRIG 89.0 04/23/2023   HDL 53.20 04/23/2023   LDLDIRECT 153.0 03/14/2022   LDLCALC 86 04/23/2023   ALT 53 04/23/2023   AST 27 04/23/2023   NA 140 04/23/2023   K  4.0 04/23/2023   CL 102 04/23/2023   CREATININE 0.85 04/23/2023   BUN 20 04/23/2023   CO2 31 04/23/2023   TSH 1.87 03/14/2022   PSA 0.73 04/23/2023   HGBA1C 5.8 04/23/2023    CT CARDIAC SCORING ([facility] LOCATIONS ONLY) Result Date: 11/22/2022 CLINICAL DATA:  63 year old Caucasian male with history of hyperlipidemia and family history of coronary artery disease. * Tracking Code: FCC * EXAM: CT CARDIAC CORONARY ARTERY CALCIUM  SCORE TECHNIQUE: Non-contrast imaging through the heart was performed using prospective ECG gating. Image post processing was performed on an independent workstation, allowing for quantitative analysis of the heart and coronary arteries. Note that  this exam targets the heart and the chest was not imaged in its entirety. COMPARISON:  No priors. FINDINGS: CORONARY CALCIUM  SCORES: Left Main: 0 LAD: 132 LCx: 8 RCA: 15 Total Agatston Score: 155 MESA database percentile: 77th AORTA MEASUREMENTS: Ascending Aorta: 3.7 cm Descending Aorta:2.9 cm OTHER FINDINGS: Atherosclerotic calcifications in the thoracic aorta. Within the visualized portions of the thorax there are no suspicious appearing pulmonary nodules or masses, there is no acute consolidative airspace disease, no pleural effusions, no pneumothorax and no lymphadenopathy. Visualized portions of the upper abdomen are unremarkable. There are no aggressive appearing lytic or blastic lesions noted in the visualized portions of the skeleton. IMPRESSION: 1. Patient's total coronary artery calcium  score is 155 which is 77th percentile for patient's of matched age, gender and race/ethnicity. Please note that although the presence of coronary artery calcium  documents the presence of coronary artery disease, the severity of this disease and any potential stenosis cannot be assessed on this noncontrast CT examination. Assessment for potential risk factor modification, dietary therapy or pharmacologic therapy may be warranted, if clinically indicated. 2.  Aortic Atherosclerosis (ICD10-I70.0). Electronically Signed   By: Alexandria Angel M.D.   On: 11/22/2022 09:03    Assessment & Plan:  Hypogonadism in male -     CBC with Differential/Platelet; Future -     Testosterone  Total,Free,Bio, Males; Future  Hyperlipidemia LDL goal <130 -     Lipid panel; Future -     TSH; Future -     CK; Future -     Hepatic function panel; Future  Essential hypertension -     Basic metabolic panel with GFR; Future -     CBC with Differential/Platelet; Future -     TSH; Future -     Hepatic function panel; Future     Follow-up: Return in about 6 months (around 04/22/2024).  Sandra Crouch, MD

## 2023-10-21 NOTE — Patient Instructions (Signed)
 Hypertension, Adult High blood pressure (hypertension) is when the force of blood pumping through the arteries is too strong. The arteries are the blood vessels that carry blood from the heart throughout the body. Hypertension forces the heart to work harder to pump blood and may cause arteries to become narrow or stiff. Untreated or uncontrolled hypertension can lead to a heart attack, heart failure, a stroke, kidney disease, and other problems. A blood pressure reading consists of a higher number over a lower number. Ideally, your blood pressure should be below 120/80. The first ("top") number is called the systolic pressure. It is a measure of the pressure in your arteries as your heart beats. The second ("bottom") number is called the diastolic pressure. It is a measure of the pressure in your arteries as the heart relaxes. What are the causes? The exact cause of this condition is not known. There are some conditions that result in high blood pressure. What increases the risk? Certain factors may make you more likely to develop high blood pressure. Some of these risk factors are under your control, including: Smoking. Not getting enough exercise or physical activity. Being overweight. Having too much fat, sugar, calories, or salt (sodium) in your diet. Drinking too much alcohol. Other risk factors include: Having a personal history of heart disease, diabetes, high cholesterol, or kidney disease. Stress. Having a family history of high blood pressure and high cholesterol. Having obstructive sleep apnea. Age. The risk increases with age. What are the signs or symptoms? High blood pressure may not cause symptoms. Very high blood pressure (hypertensive crisis) may cause: Headache. Fast or irregular heartbeats (palpitations). Shortness of breath. Nosebleed. Nausea and vomiting. Vision changes. Severe chest pain, dizziness, and seizures. How is this diagnosed? This condition is diagnosed by  measuring your blood pressure while you are seated, with your arm resting on a flat surface, your legs uncrossed, and your feet flat on the floor. The cuff of the blood pressure monitor will be placed directly against the skin of your upper arm at the level of your heart. Blood pressure should be measured at least twice using the same arm. Certain conditions can cause a difference in blood pressure between your right and left arms. If you have a high blood pressure reading during one visit or you have normal blood pressure with other risk factors, you may be asked to: Return on a different day to have your blood pressure checked again. Monitor your blood pressure at home for 1 week or longer. If you are diagnosed with hypertension, you may have other blood or imaging tests to help your health care provider understand your overall risk for other conditions. How is this treated? This condition is treated by making healthy lifestyle changes, such as eating healthy foods, exercising more, and reducing your alcohol intake. You may be referred for counseling on a healthy diet and physical activity. Your health care provider may prescribe medicine if lifestyle changes are not enough to get your blood pressure under control and if: Your systolic blood pressure is above 130. Your diastolic blood pressure is above 80. Your personal target blood pressure may vary depending on your medical conditions, your age, and other factors. Follow these instructions at home: Eating and drinking  Eat a diet that is high in fiber and potassium, and low in sodium, added sugar, and fat. An example of this eating plan is called the DASH diet. DASH stands for Dietary Approaches to Stop Hypertension. To eat this way: Eat  plenty of fresh fruits and vegetables. Try to fill one half of your plate at each meal with fruits and vegetables. Eat whole grains, such as whole-wheat pasta, brown rice, or whole-grain bread. Fill about one  fourth of your plate with whole grains. Eat or drink low-fat dairy products, such as skim milk or low-fat yogurt. Avoid fatty cuts of meat, processed or cured meats, and poultry with skin. Fill about one fourth of your plate with lean proteins, such as fish, chicken without skin, beans, eggs, or tofu. Avoid pre-made and processed foods. These tend to be higher in sodium, added sugar, and fat. Reduce your daily sodium intake. Many people with hypertension should eat less than 1,500 mg of sodium a day. Do not drink alcohol if: Your health care provider tells you not to drink. You are pregnant, may be pregnant, or are planning to become pregnant. If you drink alcohol: Limit how much you have to: 0-1 drink a day for women. 0-2 drinks a day for men. Know how much alcohol is in your drink. In the [place] one drink equals one 12 oz bottle of beer (355 mL), one 5 oz glass of wine (148 mL), or one 1 oz glass of hard liquor (44 mL). Lifestyle  Work with your health care provider to maintain a healthy body weight or to lose weight. Ask what an ideal weight is for you. Get at least 30 minutes of exercise that causes your heart to beat faster (aerobic exercise) most days of the week. Activities may include walking, swimming, or biking. Include exercise to strengthen your muscles (resistance exercise), such as Pilates or lifting weights, as part of your weekly exercise routine. Try to do these types of exercises for 30 minutes at least 3 days a week. Do not use any products that contain nicotine or tobacco. These products include cigarettes, chewing tobacco, and vaping devices, such as e-cigarettes. If you need help quitting, ask your health care provider. Monitor your blood pressure at home as told by your health care provider. Keep all follow-up visits. This is important. Medicines Take over-the-counter and prescription medicines only as told by your health care provider. Follow directions carefully. Blood  pressure medicines must be taken as prescribed. Do not skip doses of blood pressure medicine. Doing this puts you at risk for problems and can make the medicine less effective. Ask your health care provider about side effects or reactions to medicines that you should watch for. Contact a health care provider if you: Think you are having a reaction to a medicine you are taking. Have headaches that keep coming back (recurring). Feel dizzy. Have swelling in your ankles. Have trouble with your vision. Get help right away if you: Develop a severe headache or confusion. Have unusual weakness or numbness. Feel faint. Have severe pain in your chest or abdomen. Vomit repeatedly. Have trouble breathing. These symptoms may be an emergency. Get help right away. Call 911. Do not wait to see if the symptoms will go away. Do not drive yourself to the hospital. Summary Hypertension is when the force of blood pumping through your arteries is too strong. If this condition is not controlled, it may put you at risk for serious complications. Your personal target blood pressure may vary depending on your medical conditions, your age, and other factors. For most people, a normal blood pressure is less than 120/80. Hypertension is treated with lifestyle changes, medicines, or a combination of both. Lifestyle changes include losing weight, eating a healthy,  low-sodium diet, exercising more, and limiting alcohol. This information is not intended to replace advice given to you by your health care provider. Make sure you discuss any questions you have with your health care provider. Document Revised: 03/20/2021 Document Reviewed: 03/20/2021 Elsevier Patient Education  2024 [employer].

## 2023-10-22 ENCOUNTER — Ambulatory Visit: Payer: Self-pay | Admitting: Internal Medicine

## 2023-10-22 LAB — TESTOSTERONE TOTAL,FREE,BIO, MALES
Albumin: 4.2 g/dL (ref 3.6–5.1)
Sex Hormone Binding: 34 nmol/L (ref 22–77)
Testosterone, Bioavailable: 91 ng/dL — ABNORMAL LOW (ref 110.0–575.0)
Testosterone, Free: 47.2 pg/mL (ref 46.0–224.0)
Testosterone: 367 ng/dL (ref 250–827)

## 2023-12-01 ENCOUNTER — Encounter: Payer: Self-pay | Admitting: Internal Medicine

## 2023-12-03 ENCOUNTER — Other Ambulatory Visit: Payer: Self-pay | Admitting: Internal Medicine

## 2023-12-03 DIAGNOSIS — E291 Testicular hypofunction: Secondary | ICD-10-CM

## 2023-12-03 MED ORDER — TESTOSTERONE 20.25 MG/ACT (1.62%) TD GEL: 2.0000 | Freq: Every day | TRANSDERMAL | 1 refills | Status: DC

## 2023-12-03 NOTE — Telephone Encounter (Signed)
 Copied from CRM [id number]. Topic: Clinical - Medication Refill >> Dec 03, 2023 12:58 PM Burnard DEL wrote: Medication: Testosterone  20.25 MG/ACT (1.62%) GEL  Has the patient contacted their pharmacy? No (Agent: If no, request that the patient contact the pharmacy for the refill. If patient does not wish to contact the pharmacy document the reason why and proceed with request.) (Agent: If yes, when and what did the pharmacy advise?)  This is the patient's preferred pharmacy:  [facility] - [place], [place] - [address] [place] [address] Phone: [phone] Fax: [phone]  Is this the correct pharmacy for this prescription? Yes If no, delete pharmacy and type the correct one.   Has the prescription been filled recently? No  Is the patient out of the medication? No(little left)  Has the patient been seen for an appointment in the last year OR does the patient have an upcoming appointment? Yes  Can we respond through MyChart? Yes  Agent: Please be advised that Rx refills may take up to 3 business days. We ask that you follow-up with your pharmacy.

## 2023-12-08 ENCOUNTER — Other Ambulatory Visit: Payer: Self-pay | Admitting: Internal Medicine

## 2023-12-08 DIAGNOSIS — I10 Essential (primary) hypertension: Secondary | ICD-10-CM

## 2023-12-08 DIAGNOSIS — E876 Hypokalemia: Secondary | ICD-10-CM

## 2023-12-11 ENCOUNTER — Encounter: Payer: Self-pay | Admitting: Internal Medicine

## 2024-01-05 ENCOUNTER — Other Ambulatory Visit: Payer: Self-pay | Admitting: Internal Medicine

## 2024-01-05 DIAGNOSIS — I10 Essential (primary) hypertension: Secondary | ICD-10-CM

## 2024-04-06 ENCOUNTER — Encounter: Payer: Self-pay | Admitting: Internal Medicine

## 2024-04-15 ENCOUNTER — Other Ambulatory Visit: Payer: Self-pay | Admitting: Internal Medicine

## 2024-04-15 DIAGNOSIS — B001 Herpesviral vesicular dermatitis: Secondary | ICD-10-CM

## 2024-04-27 ENCOUNTER — Ambulatory Visit: Admitting: Internal Medicine

## 2024-05-06 ENCOUNTER — Ambulatory Visit: Admitting: Internal Medicine

## 2024-05-11 ENCOUNTER — Other Ambulatory Visit: Payer: Self-pay

## 2024-05-11 ENCOUNTER — Other Ambulatory Visit (HOSPITAL_BASED_OUTPATIENT_CLINIC_OR_DEPARTMENT_OTHER): Payer: Self-pay

## 2024-05-11 ENCOUNTER — Emergency Department (HOSPITAL_BASED_OUTPATIENT_CLINIC_OR_DEPARTMENT_OTHER)
Admission: EM | Admit: 2024-05-11 | Discharge: 2024-05-11 | Disposition: A | Discharge status: Home / Self Care | Attending: Emergency Medicine | Admitting: Emergency Medicine

## 2024-05-11 ENCOUNTER — Encounter (HOSPITAL_BASED_OUTPATIENT_CLINIC_OR_DEPARTMENT_OTHER): Payer: Self-pay

## 2024-05-11 DIAGNOSIS — M792 Neuralgia and neuritis, unspecified: Secondary | ICD-10-CM

## 2024-05-11 DIAGNOSIS — M5410 Radiculopathy, site unspecified: Secondary | ICD-10-CM | POA: Diagnosis not present

## 2024-05-11 DIAGNOSIS — Z7982 Long term (current) use of aspirin: Secondary | ICD-10-CM | POA: Diagnosis not present

## 2024-05-11 DIAGNOSIS — M25511 Pain in right shoulder: Secondary | ICD-10-CM | POA: Diagnosis not present

## 2024-05-11 MED ORDER — METHYLPREDNISOLONE 4 MG PO TBPK
ORAL_TABLET | ORAL | 0 refills | Status: DC
  Filled 2024-05-11: qty 21, 6d supply, fill #0

## 2024-05-11 MED ORDER — KETOROLAC TROMETHAMINE 15 MG/ML IJ SOLN
15.0000 mg | Freq: Once | INTRAMUSCULAR | Status: AC
  Administered 2024-05-11: 08:00:00 15 mg via INTRAMUSCULAR
  Filled 2024-05-11: qty 1

## 2024-05-11 MED ORDER — ACETAMINOPHEN 500 MG PO TABS
1000.0000 mg | ORAL_TABLET | Freq: Once | ORAL | Status: AC
  Administered 2024-05-11: 08:00:00 1000 mg via ORAL
  Filled 2024-05-11: qty 2

## 2024-05-11 MED ORDER — DICLOFENAC SODIUM 1 % EX GEL
4.0000 g | Freq: Four times a day (QID) | CUTANEOUS | 0 refills | Status: AC
  Filled 2024-05-11: qty 100, 6d supply, fill #0

## 2024-05-11 NOTE — ED Notes (Signed)
 Discharge instructions reviewed with patient. Patient verbalizes understanding, no further questions at this time. Medications/prescriptions and follow up information provided. No acute distress noted at time of departure.

## 2024-05-11 NOTE — ED Provider Notes (Signed)
 [place] [facility] Provider Note   CSN: [id number] Arrival date & time: 05/11/24  9282     Patient presents with: Arm Pain   Lawrence Smith is a 63 y.o. male.   63 yo M with a chief complaints of right shoulder pain.  Patient apparently was doing some yard work on Saturday.  He said he rode on a riding mower and picked up some sticks.  On Sunday had developed some right posterior shoulder discomfort.  Seems to come and go but was quite a bit worse this morning and had radiation of the pain down the back of his arm.  Denied trauma denied neck pain.   Arm Pain       Prior to Admission medications  Medication Sig Start Date End Date Taking? Authorizing Provider  diclofenac  Sodium (VOLTAREN ) 1 % GEL Apply 4 g topically 4 (four) times daily. 05/11/24  Yes Emil Share, DO  methylPREDNISolone  (MEDROL  DOSEPAK) 4 MG TBPK tablet Day 1: 8mg  before breakfast, 4 mg after lunch, 4 mg after supper, and 8 mg at bedtime Day 2: 4 mg before breakfast, 4 mg after lunch, 4 mg  after supper, and 8 mg  at bedtime Day 3:  4 mg  before breakfast, 4 mg  after lunch, 4 mg after supper, and 4 mg  at bedtime Day 4: 4 mg  before breakfast, 4 mg  after lunch, and 4 mg at bedtime Day 5: 4 mg  before breakfast and 4 mg at bedtime Day 6: 4 mg  before breakfast 05/11/24  Yes Ka Flammer, DO  aspirin  81 MG chewable tablet Chew 1 tablet (81 mg total) by mouth daily. 11/22/22   Joshua Debby CROME, MD  indapamide  (LOZOL ) 2.5 MG tablet TAKE 1 TABLET DAILY 05/20/23   Joshua Debby CROME, MD  KLOR-CON  M20 20 MEQ tablet TAKE 1 TABLET TWICE A DAY 12/16/23   Joshua Debby CROME, MD  mupirocin  ointment (BACTROBAN ) 2 % Apply 1 Application topically daily. 08/28/23     rosuvastatin  (CRESTOR ) 20 MG tablet Take 1 tablet (20 mg total) by mouth daily. Patient not taking: Reported on 10/21/2023 12/12/22   Joshua Debby CROME, MD  tadalafil  (CIALIS ) 20 MG tablet TAKE 1 TABLET DAILY AS NEEDED. [facility] EQUIVALENT FOR  CIALIS ) 09/19/22   Joshua Debby CROME, MD  Testosterone  20.25 MG/ACT (1.62%) GEL Place 2 Pump onto the skin daily. 12/03/23   Joshua Debby CROME, MD  valACYclovir  (VALTREX ) 1000 MG tablet TAKE 1 TABLET TWICE A DAY 04/15/24   Joshua Debby CROME, MD  valsartan  (DIOVAN ) 320 MG tablet TAKE 1 TABLET DAILY 01/05/24   Joshua Debby CROME, MD    Allergies: Penicillins    Review of Systems  Updated Vital Signs BP (!) 158/91 (BP Location: Left Arm)   Pulse 63   Temp 97.9 F (36.6 C) (Oral)   Resp 16   Ht 5' 9 (1.753 m)   Wt 87.5 kg   SpO2 98%   BMI 28.50 kg/m   Physical Exam Vitals and nursing note reviewed.  Constitutional:      Appearance: He is well-developed.  HENT:     Head: Normocephalic and atraumatic.  Eyes:     Pupils: Pupils are equal, round, and reactive to light.  Neck:     Vascular: No JVD.  Cardiovascular:     Rate and Rhythm: Normal rate and regular rhythm.     Heart sounds: No murmur heard.    No friction rub. No  gallop.  Pulmonary:     Effort: No respiratory distress.     Breath sounds: No wheezing.  Abdominal:     General: There is no distension.     Tenderness: There is no abdominal tenderness. There is no guarding or rebound.  Musculoskeletal:        General: Normal range of motion.     Cervical back: Normal range of motion and neck supple.     Comments: Patient points to the right trapezius muscle belly.  I am not able to obviously reproduce his discomfort on palpation.  He does feel it is in 1 spot in between the scapula and the thoracic spine worst about T3 and 4.  Pulse motor and sensation intact the right upper extremity.  Negative Spurling's.  Skin:    Coloration: Skin is not pale.     Findings: No rash.  Neurological:     Mental Status: He is alert and oriented to person, place, and time.  Psychiatric:        Behavior: Behavior normal.     (all labs ordered are listed, but only abnormal results are displayed) Labs Reviewed - No data to  display  EKG: None  Radiology: No results found.   Procedures   Medications Ordered in the ED  ketorolac  (TORADOL ) 15 MG/ML injection 15 mg (has no administration in time range)  acetaminophen  (TYLENOL ) tablet 1,000 mg (has no administration in time range)                                    Medical Decision Making Risk OTC drugs. Prescription drug management.   63 yo M with a chief complaint of right shoulder pain.  This has been going on for about 3 days now.  Started after he had done some yard work at home.  By history most likely trapezius strain.  I am unable to reproduce his discomfort on exam.  Neurovascularly intact.  Will treat supportively.  PCP follow-up.  7:59 AM:  I have discussed the diagnosis/risks/treatment options with the patient and family.  Evaluation and diagnostic testing in the emergency department does not suggest an emergent condition requiring admission or immediate intervention beyond what has been performed at this time.  They will follow up with PCP. We also discussed returning to the ED immediately if new or worsening sx occur. We discussed the sx which are most concerning (e.g., sudden worsening pain, fever, inability to tolerate by mouth) that necessitate immediate return. Medications administered to the patient during their visit and any new prescriptions provided to the patient are listed below.  Medications given during this visit Medications  ketorolac  (TORADOL ) 15 MG/ML injection 15 mg (has no administration in time range)  acetaminophen  (TYLENOL ) tablet 1,000 mg (has no administration in time range)     The patient appears reasonably screen and/or stabilized for discharge and I doubt any other medical condition or other [facility] requiring further screening, evaluation, or treatment in the ED at this time prior to discharge.       Final diagnoses:  Radicular pain in right arm    ED Discharge Orders          Ordered    diclofenac  Sodium  (VOLTAREN ) 1 % GEL  4 times daily        05/11/24 0756    methylPREDNISolone  (MEDROL  DOSEPAK) 4 MG TBPK tablet        05/11/24  9243               Emil Share, DO 05/11/24 [phone]

## 2024-05-11 NOTE — ED Triage Notes (Signed)
 Woke up a few days ago with pain radiating from right shoulder down arm. States pain improves with lying down. Took advil with some improvement. Denies known injury. Denies chest pain.

## 2024-05-11 NOTE — Discharge Instructions (Signed)
 Take the steroids as prescribed.  Use the gel as prescribed. Also take tylenol  1000mg (2 extra strength) four times a day.   Please let your family doctor know about this.  Sometimes she would benefit from physical therapy.  Please return for weakness to the arm.  Stretches can also help.  Try [email or web address]

## 2024-05-13 ENCOUNTER — Encounter: Payer: Self-pay | Admitting: Internal Medicine

## 2024-05-14 ENCOUNTER — Other Ambulatory Visit: Payer: Self-pay | Admitting: Internal Medicine

## 2024-05-14 DIAGNOSIS — I10 Essential (primary) hypertension: Secondary | ICD-10-CM

## 2024-05-17 ENCOUNTER — Encounter (HOSPITAL_BASED_OUTPATIENT_CLINIC_OR_DEPARTMENT_OTHER): Payer: Self-pay

## 2024-05-17 ENCOUNTER — Emergency Department (HOSPITAL_BASED_OUTPATIENT_CLINIC_OR_DEPARTMENT_OTHER)
Admission: EM | Admit: 2024-05-17 | Discharge: 2024-05-17 | Disposition: A | Discharge status: Home / Self Care | Payer: Self-pay | Source: Ambulatory Visit | Attending: Emergency Medicine | Admitting: Emergency Medicine

## 2024-05-17 ENCOUNTER — Emergency Department (HOSPITAL_BASED_OUTPATIENT_CLINIC_OR_DEPARTMENT_OTHER): Payer: Self-pay

## 2024-05-17 ENCOUNTER — Ambulatory Visit: Payer: Self-pay | Admitting: Internal Medicine

## 2024-05-17 ENCOUNTER — Other Ambulatory Visit (HOSPITAL_BASED_OUTPATIENT_CLINIC_OR_DEPARTMENT_OTHER): Payer: Self-pay

## 2024-05-17 ENCOUNTER — Other Ambulatory Visit: Payer: Self-pay

## 2024-05-17 DIAGNOSIS — Z79899 Other long term (current) drug therapy: Secondary | ICD-10-CM | POA: Insufficient documentation

## 2024-05-17 DIAGNOSIS — Z7982 Long term (current) use of aspirin: Secondary | ICD-10-CM | POA: Insufficient documentation

## 2024-05-17 DIAGNOSIS — I10 Essential (primary) hypertension: Secondary | ICD-10-CM | POA: Insufficient documentation

## 2024-05-17 DIAGNOSIS — M5412 Radiculopathy, cervical region: Secondary | ICD-10-CM | POA: Insufficient documentation

## 2024-05-17 DIAGNOSIS — M4802 Spinal stenosis, cervical region: Secondary | ICD-10-CM | POA: Diagnosis not present

## 2024-05-17 DIAGNOSIS — M4722 Other spondylosis with radiculopathy, cervical region: Secondary | ICD-10-CM | POA: Diagnosis not present

## 2024-05-17 MED ORDER — CYCLOBENZAPRINE HCL 10 MG PO TABS
10.0000 mg | ORAL_TABLET | Freq: Two times a day (BID) | ORAL | 0 refills | Status: DC | PRN
  Filled 2024-05-17: qty 20, 10d supply, fill #0

## 2024-05-17 MED ORDER — PREDNISONE 10 MG PO TABS
ORAL_TABLET | ORAL | 0 refills | Status: AC
  Filled 2024-05-17: qty 30, 10d supply, fill #0

## 2024-05-17 MED ORDER — HYDROCODONE-ACETAMINOPHEN 5-325 MG PO TABS
1.0000 | ORAL_TABLET | Freq: Four times a day (QID) | ORAL | 0 refills | Status: DC | PRN
  Filled 2024-05-17: qty 12, 3d supply, fill #0

## 2024-05-17 NOTE — Telephone Encounter (Signed)
" ° °  FYI Only or Action Required?: Action required by provider: clinical question for provider and medication request.  Patient was last seen in primary care on 10/21/2023 by Joshua Debby CROME, MD.  Called Nurse Triage reporting Back Pain.  Symptoms began a week ago.  Interventions attempted: OTC medications: Tylenol , Advil, Prednisone .  Symptoms are: gradually worsening.  Triage Disposition: See PCP When Office is Open (Within 3 Days)  Patient/caregiver understands and will follow disposition?: Yes  Copied from CRM #[id number]. Topic: Clinical - Red Word Triage >> May 17, 2024  8:29 AM Olam RAMAN wrote: Red Word that prompted transfer to Nurse Triage:  pt was in er for sever back pain, upper right arm and was rx prednedzone. may need a ref from a pain dr. Has an upcoming appt 1/7 Still in pain Reason for Disposition  [1] MODERATE back pain (e.g., interferes with normal activities) AND [2] present > 3 days  Answer Assessment - Initial Assessment Questions 1. ONSET: When did the pain begin? (e.g., minutes, hours, days)     X 9 days   2. LOCATION: Where does it hurt? (upper, mid or lower back)      Mid back and down right arm. Sometimes under right shoulder blade  3. SEVERITY: How bad is the pain?  (e.g., Scale 1-10; mild, moderate, or severe)      3/10  4. PATTERN: Is the pain constant? (e.g., yes, no; constant, intermittent)       Comes and goes  5. RADIATION: Does the pain shoot into your legs or somewhere else?      Right arm  6. CAUSE:  What do you think is causing the back pain?       Possible pinched nerve  7. BACK OVERUSE:  Pt denies recent lifting of heavy objects, strenuous work or exercise      Pt states he did yard work prior to symptoms and is very active  8. MEDICINES: What have you taken so far for the pain? (e.g., nothing, acetaminophen , NSAIDS)      Advil and Tylenol   9. NEUROLOGIC SYMPTOMS: Pt denies any weakness or problems with bowel/bladder  control      Pt states he has slight numbness in fingers on right hand sometimes (comes and goes). Pt states he can wiggle fingers on right and hand and grip  10. OTHER SYMPTOMS: Pt denies fever, abdomen pain, burning with urination, blood in urine        Pt states light exercises help to relieve the pain.  Pt reports Back Pain Pt is taking Prednisone  d/c Saturday, Advil, Tylenol  Pt requesting medication for the pain prior to appt.  Routing to clinic for assistance with pt's request. Pt seen in ED for symptoms on 12.16.25. Pt scheduled for a visit on  12.26.25 for hospital follow up. Pt agrees with plan of care, will call back for any worsening symptoms  Protocols used: Back Pain-A-AH  "

## 2024-05-17 NOTE — ED Triage Notes (Signed)
 Pt complains of neck pain that radiates down right arm. States that he was seen last week for same. States that his doctor can't see him until Friday and that is why he is here today. States that he has been taking his oral prednisone  and it is now complete.

## 2024-05-17 NOTE — Discharge Instructions (Signed)
 Take the medications as needed for pain and discomfort.  Follow-up with your primary care doctor or Dr. Onetha for further evaluation of the symptoms persist

## 2024-05-17 NOTE — ED Provider Notes (Signed)
 " [place] [facility] Provider Note   CSN: [id number] Arrival date & time: 05/17/24  1220     Patient presents with: Neck Pain   Lawrence Smith is a 63 y.o. male.    Neck Pain    Patient has history of hypertension BPH hyperglycemia.  Patient states he started having shoulder pain over a week ago.  He was doing some yard work.  He started developing some right posterior shoulder discomfort.  It started radiating down his right arm.  He has not had any trouble with any chest pain or shortness of breath.  He has also started to notice some tingling numbness in his fingertips.  Patient went to the emergency room it was seen on December 16.  His exam was suggestive of cervical radiculopathy versus trapezius strain.  Patient was discharged home with supportive care.  Patient states he continues to have pain and discomfort.  He was post to follow-up with his primary care doctor.  Patient initially was scheduled to follow-up with his doctor doctor on January 7 as he was starting to note improvement.  The office had offered an appointment sooner with another provider.  This morning with worsening pain patient sent a message to the doctor's office and came to the ED for evaluation  Prior to Admission medications  Medication Sig Start Date End Date Taking? Authorizing Provider  cyclobenzaprine  (FLEXERIL ) 10 MG tablet Take 1 tablet (10 mg total) by mouth 2 (two) times daily as needed for muscle spasms. 05/17/24  Yes Randol Simmonds, MD  HYDROcodone -acetaminophen  (NORCO/VICODIN) 5-325 MG tablet Take 1 tablet by mouth every 6 (six) hours as needed. 05/17/24  Yes Randol Simmonds, MD  predniSONE  (DELTASONE ) 10 MG tablet Take 5 tablets (50 mg total) by mouth daily with breakfast for 2 days, THEN 4 tablets (40 mg total) daily with breakfast for 2 days, THEN 3 tablets (30 mg total) daily with breakfast for 2 days, THEN 2 tablets (20 mg total) daily with breakfast for 2 days, THEN 1  tablet (10 mg total) daily with breakfast for 2 days. 05/17/24 05/27/24 Yes Randol Simmonds, MD  aspirin  81 MG chewable tablet Chew 1 tablet (81 mg total) by mouth daily. 11/22/22   Joshua Debby CROME, MD  diclofenac  Sodium (VOLTAREN ) 1 % GEL Apply 4 g topically 4 (four) times daily. 05/11/24   Emil Share, DO  indapamide  (LOZOL ) 2.5 MG tablet TAKE 1 TABLET DAILY 05/20/23   Joshua Debby CROME, MD  KLOR-CON  M20 20 MEQ tablet TAKE 1 TABLET TWICE A DAY 12/16/23   Joshua Debby CROME, MD  methylPREDNISolone  (MEDROL  DOSEPAK) 4 MG TBPK tablet Day 1: 8mg  before breakfast, 4 mg after lunch, 4 mg after supper, and 8 mg at bedtime Day 2: 4 mg before breakfast, 4 mg after lunch, 4 mg  after supper, and 8 mg  at bedtime Day 3:  4 mg  before breakfast, 4 mg  after lunch, 4 mg after supper, and 4 mg  at bedtime Day 4: 4 mg  before breakfast, 4 mg  after lunch, and 4 mg at bedtime Day 5: 4 mg  before breakfast and 4 mg at bedtime Day 6: 4 mg  before breakfast 05/11/24   Floyd, Dan, DO  mupirocin  ointment (BACTROBAN ) 2 % Apply 1 Application topically daily. 08/28/23     rosuvastatin  (CRESTOR ) 20 MG tablet Take 1 tablet (20 mg total) by mouth daily. Patient not taking: Reported on 10/21/2023 12/12/22   Joshua Debby  L, MD  tadalafil  (CIALIS ) 20 MG tablet TAKE 1 TABLET DAILY AS NEEDED. (GENERIC EQUIVALENT FOR CIALIS ) 09/19/22   Joshua Debby CROME, MD  Testosterone  20.25 MG/ACT (1.62%) GEL Place 2 Pump onto the skin daily. 12/03/23   Joshua Debby CROME, MD  valACYclovir  (VALTREX ) 1000 MG tablet TAKE 1 TABLET TWICE A DAY 04/15/24   Joshua Debby CROME, MD  valsartan  (DIOVAN ) 320 MG tablet TAKE 1 TABLET DAILY 01/05/24   Joshua Debby CROME, MD    Allergies: Penicillins    Review of Systems  Musculoskeletal:  Positive for neck pain.    Updated Vital Signs BP (!) 153/95 (BP Location: Right Arm)   Pulse 86   Temp 98 F (36.7 C) (Oral)   Resp 16   Ht 1.753 m (5' 9)   Wt 87.5 kg   SpO2 98%   BMI 28.49 kg/m   Physical Exam Vitals and nursing note  reviewed.  Constitutional:      General: He is not in acute distress.    Appearance: He is well-developed.  HENT:     Head: Normocephalic and atraumatic.     Right Ear: External ear normal.     Left Ear: External ear normal.  Eyes:     General: No scleral icterus.       Right eye: No discharge.        Left eye: No discharge.     Conjunctiva/sclera: Conjunctivae normal.  Neck:     Trachea: No tracheal deviation.  Cardiovascular:     Rate and Rhythm: Normal rate.  Pulmonary:     Effort: Pulmonary effort is normal. No respiratory distress.     Breath sounds: No stridor.  Abdominal:     General: There is no distension.  Musculoskeletal:        General: No swelling, tenderness or deformity.     Cervical back: Neck supple.     Comments: No focal areas of tenderness  Skin:    General: Skin is warm and dry.     Findings: No rash.  Neurological:     Mental Status: He is alert. Mental status is at baseline.     Cranial Nerves: No dysarthria or facial asymmetry.     Motor: No seizure activity.     (all labs ordered are listed, but only abnormal results are displayed) Labs Reviewed - No data to display  EKG: None  Radiology: No results found.   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  Risk Prescription drug management.  Exam reassuring.  No findings to suggest acute infection.  Doubt referred pain from GI source or cardiac etiology.  Patient is not having any chest pain no difficulty breathing or abdominal pain Patient's symptoms are suggestive of cervical radiculopathy.  Will try another course of  steroid taper.  Will also prescribe muscle relaxant and short course of hydrocodone .  Discussed outpatient follow-up with his PCP or neurosurgeon that he has seen in the  past.     Final diagnoses:  Cervical radiculopathy    ED Discharge Orders          Ordered    predniSONE  (DELTASONE ) 10 MG tablet  Q breakfast        05/17/24 1448    cyclobenzaprine  (FLEXERIL ) 10 MG tablet  2 times daily PRN        05/17/24 1448    HYDROcodone -acetaminophen  (NORCO/VICODIN) 5-325 MG tablet  Every 6 hours PRN        05/17/24 1448               Randol Simmonds, MD 05/20/24 [phone]  "

## 2024-05-21 ENCOUNTER — Inpatient Hospital Stay: Admitting: Internal Medicine

## 2024-05-26 ENCOUNTER — Other Ambulatory Visit: Payer: Self-pay

## 2024-05-26 ENCOUNTER — Emergency Department (HOSPITAL_COMMUNITY)
Admission: EM | Admit: 2024-05-26 | Discharge: 2024-05-26 | Disposition: A | Discharge status: Home / Self Care | Attending: Emergency Medicine | Admitting: Emergency Medicine

## 2024-05-26 ENCOUNTER — Emergency Department (HOSPITAL_COMMUNITY)

## 2024-05-26 DIAGNOSIS — Z7982 Long term (current) use of aspirin: Secondary | ICD-10-CM | POA: Diagnosis not present

## 2024-05-26 DIAGNOSIS — M542 Cervicalgia: Secondary | ICD-10-CM

## 2024-05-26 DIAGNOSIS — R930 Abnormal findings on diagnostic imaging of skull and head, not elsewhere classified: Secondary | ICD-10-CM | POA: Insufficient documentation

## 2024-05-26 DIAGNOSIS — M792 Neuralgia and neuritis, unspecified: Secondary | ICD-10-CM

## 2024-05-26 DIAGNOSIS — Z79899 Other long term (current) drug therapy: Secondary | ICD-10-CM | POA: Insufficient documentation

## 2024-05-26 DIAGNOSIS — M5410 Radiculopathy, site unspecified: Secondary | ICD-10-CM | POA: Insufficient documentation

## 2024-05-26 MED ORDER — HYDROCODONE-ACETAMINOPHEN 5-325 MG PO TABS: 1.0000 | ORAL_TABLET | Freq: Four times a day (QID) | ORAL | 0 refills | Status: AC | PRN

## 2024-05-26 MED ORDER — LORAZEPAM 1 MG PO TABS
1.0000 mg | ORAL_TABLET | Freq: Once | ORAL | Status: DC
  Filled 2024-05-26: qty 1

## 2024-05-26 MED ORDER — METHOCARBAMOL 750 MG PO TABS: 750.0000 mg | ORAL_TABLET | Freq: Three times a day (TID) | ORAL | 0 refills | Status: AC | PRN

## 2024-05-26 NOTE — ED Triage Notes (Signed)
 Patient reports persistent pain at posterior neck radiating to upper back and right arm for several weeks unrelieved by Rx pain medications , denies injury or fall.

## 2024-05-26 NOTE — ED Notes (Signed)
 Patient transported to MRI

## 2024-05-26 NOTE — ED Provider Notes (Addendum)
 " [place] [facility] Provider Note   CSN: [id number] Arrival date & time: 05/26/24  9372     Patient presents with: Back Pain and Neck Pain    Lawrence Smith is a 63 y.o. male.   Pt c/o radicular pain from right neck radiating towards right scapula, and right shoulder and down right arrm, at times with numbness/tingling along forearm and small and ring fingers. Symptoms present for past 2-3 weeks. No specific injury/strain recalled. History ddd. Has not yet seen neurosurgery for same, and says did try a 4-5 day, and then a bit longer steroid taper for same - some relief of symptoms, but now is about to run out. No weakness or loss of functional ability. No swelling to arm. No rash/skin lesions to area/distribution of pain. No headache. No fevers.   The history is provided by the patient, the spouse and medical records.  Back Pain Associated symptoms: numbness   Associated symptoms: no fever, no headaches and no weakness        Prior to Admission medications  Medication Sig Start Date End Date Taking? Authorizing Provider  methocarbamol (ROBAXIN) 750 MG tablet Take 1 tablet (750 mg total) by mouth 3 (three) times daily as needed (muscle spasm/pain). 05/26/24  Yes Bernard Drivers, MD  aspirin  81 MG chewable tablet Chew 1 tablet (81 mg total) by mouth daily. 11/22/22   Joshua Debby CROME, MD  diclofenac  Sodium (VOLTAREN ) 1 % GEL Apply 4 g topically 4 (four) times daily. 05/11/24   Floyd, Dan, DO  HYDROcodone -acetaminophen  (NORCO/VICODIN) 5-325 MG tablet Take 1 tablet by mouth every 6 (six) hours as needed. 05/17/24   Randol Simmonds, MD  indapamide  (LOZOL ) 2.5 MG tablet TAKE 1 TABLET DAILY 05/17/24   Joshua Debby CROME, MD  KLOR-CON  M20 20 MEQ tablet TAKE 1 TABLET TWICE A DAY 12/16/23   Joshua Debby CROME, MD  methylPREDNISolone  (MEDROL  DOSEPAK) 4 MG TBPK tablet Day 1: 8mg  before breakfast, 4 mg after lunch, 4 mg after supper, and 8 mg at bedtime Day 2: 4 mg before  breakfast, 4 mg after lunch, 4 mg  after supper, and 8 mg  at bedtime Day 3:  4 mg  before breakfast, 4 mg  after lunch, 4 mg after supper, and 4 mg  at bedtime Day 4: 4 mg  before breakfast, 4 mg  after lunch, and 4 mg at bedtime Day 5: 4 mg  before breakfast and 4 mg at bedtime Day 6: 4 mg  before breakfast 05/11/24   Floyd, Dan, DO  mupirocin  ointment (BACTROBAN ) 2 % Apply 1 Application topically daily. 08/28/23     predniSONE  (DELTASONE ) 10 MG tablet Take 5 tablets (50 mg total) by mouth daily with breakfast for 2 days, THEN 4 tablets (40 mg total) daily with breakfast for 2 days, THEN 3 tablets (30 mg total) daily with breakfast for 2 days, THEN 2 tablets (20 mg total) daily with breakfast for 2 days, THEN 1 tablet (10 mg total) daily with breakfast for 2 days. 05/17/24 05/27/24  Randol Simmonds, MD  rosuvastatin  (CRESTOR ) 20 MG tablet Take 1 tablet (20 mg total) by mouth daily. Patient not taking: Reported on 10/21/2023 12/12/22   Joshua Debby CROME, MD  tadalafil  (CIALIS ) 20 MG tablet TAKE 1 TABLET DAILY AS NEEDED. (GENERIC EQUIVALENT FOR CIALIS ) 09/19/22   Joshua Debby CROME, MD  Testosterone  20.25 MG/ACT (1.62%) GEL Place 2 Pump onto the skin daily. 12/03/23   Joshua Debby CROME, MD  valACYclovir  (VALTREX ) 1000 MG tablet TAKE 1 TABLET TWICE A DAY 04/15/24   Joshua Debby CROME, MD  valsartan  (DIOVAN ) 320 MG tablet TAKE 1 TABLET DAILY 01/05/24   Joshua Debby CROME, MD    Allergies: Penicillins    Review of Systems  Constitutional:  Negative for fever.  Musculoskeletal:  Positive for neck pain.  Skin:  Negative for rash and wound.  Neurological:  Positive for numbness. Negative for weakness and headaches.    Updated Vital Signs BP 129/88 (BP Location: Right Arm)   Pulse 65   Temp 98 F (36.7 C) (Oral)   Resp 18   SpO2 100%   Physical Exam Vitals and nursing note reviewed.  Constitutional:      Appearance: Normal appearance. He is well-developed.  HENT:     Head: Atraumatic.     Nose: Nose normal.      Mouth/Throat:     Mouth: Mucous membranes are moist.  Eyes:     General: No scleral icterus.    Conjunctiva/sclera: Conjunctivae normal.  Neck:     Trachea: No tracheal deviation.  Cardiovascular:     Rate and Rhythm: Normal rate and regular rhythm.     Pulses: Normal pulses.  Pulmonary:     Effort: Pulmonary effort is normal. No accessory muscle usage or respiratory distress.  Musculoskeletal:        General: No swelling.     Cervical back: Normal range of motion and neck supple. No rigidity.     Comments: C spine non tender, aligned, no step off.  No neck/back soft  tissue swelling. No shingles/rash to distribution area of pain. Good passive rom at right shoulder and elbow without pain. RUE is of normal color and warmth with intact distal pulses. No swelling.   Skin:    General: Skin is warm and dry.     Findings: No rash.  Neurological:     Mental Status: He is alert.     Comments: Alert, speech clear. RUE nvi with intact motor/sens fxn. Right rad/med/uln nerve fxn intact, stre 5/5, sens grossly intact.   Psychiatric:        Mood and Affect: Mood normal.     (all labs ordered are listed, but only abnormal results are displayed) Labs Reviewed - No data to display  EKG: None  Radiology: No results found.   Procedures   Medications Ordered in the ED  LORazepam  (ATIVAN ) tablet 1 mg (has no administration in time range)                                    Medical Decision Making Problems Addressed: Neck pain on right side: acute illness or injury    Details: Acute/subacute, past 2-3 weeks.  Radicular pain in right arm: acute illness or injury    Details: Acute/subacute, past 2-3 weeks  Amount and/or Complexity of Data Reviewed Independent Historian: spouse    Details: hx External Data Reviewed: notes. Radiology: ordered and independent interpretation performed. Decision-making details documented in ED Course.  Risk Prescription drug management.   Reviewed  nursing notes and prior charts for additional history.   Discussed options with pt/spouse, re steroid taper, pain meds, MR imaging.   Pt requests MRI.  MRI ordered.   RN indicates pt requests med for anxiety/small space for MRI. Ativan  po.   Pt currently denies needing/wanting anything for pain.   1517 MRI pending, signed out to  Dr Patsey to check MRI when resulted and dispo appropriately.        Final diagnoses:  Neck pain on right side  Radicular pain in right arm    ED Discharge Orders          Ordered    methocarbamol (ROBAXIN) 750 MG tablet  3 times daily PRN        05/26/24 1508                Neriyah Cercone, MD 05/26/24 1518  "

## 2024-05-26 NOTE — Discharge Instructions (Addendum)
 It was our pleasure to provide your ER care today - we hope that you feel better.  Try gentle massage, stretching and/or heat therapy to area of pain.  Take acetaminophen  as need for pain. You may also take robaxin as need for muscle pain/spasm - no driving when taking.   Follow up with neck/back specialist in the next  1-2 weeks - call office to try to move up appointment, cancellations, etc.  Also discuss with your doctor setting up outpatient physical therapy to see if that may help your symptoms (or use PT info for provider on discharge instructions).   Return to ER if worse, new symptoms, severe/intractable pain, numbness/weakness or loss of function, or other concern.   You were given some sedating medication for the MRI - no driving for the next 6 hours.

## 2024-05-26 NOTE — ED Notes (Signed)
 ED Provider at bedside.

## 2024-05-26 NOTE — ED Notes (Signed)
 Patient here for back pain. Patient with hx of chronic back pain but has progressively gotten worse over the last couple days. Patient in ED stretcher in semi-fowler position. Patient A&Ox4. Speech is clear and coherent. No signs of distress at this time. Family at bedside. Bed locked and at the lowest position.

## 2024-06-02 ENCOUNTER — Ambulatory Visit: Payer: Self-pay | Admitting: Internal Medicine

## 2024-06-02 ENCOUNTER — Encounter: Payer: Self-pay | Admitting: Internal Medicine

## 2024-06-02 ENCOUNTER — Ambulatory Visit: Admitting: Internal Medicine

## 2024-06-02 VITALS — BP 138/86 | HR 90 | Temp 98.7°F | Resp 16 | Ht 69.0 in | Wt 192.4 lb

## 2024-06-02 DIAGNOSIS — Z23 Encounter for immunization: Secondary | ICD-10-CM | POA: Diagnosis not present

## 2024-06-02 DIAGNOSIS — I10 Essential (primary) hypertension: Secondary | ICD-10-CM | POA: Diagnosis not present

## 2024-06-02 DIAGNOSIS — E291 Testicular hypofunction: Secondary | ICD-10-CM | POA: Diagnosis not present

## 2024-06-02 DIAGNOSIS — Z0000 Encounter for general adult medical examination without abnormal findings: Secondary | ICD-10-CM | POA: Diagnosis not present

## 2024-06-02 DIAGNOSIS — E785 Hyperlipidemia, unspecified: Secondary | ICD-10-CM | POA: Diagnosis not present

## 2024-06-02 DIAGNOSIS — Z0001 Encounter for general adult medical examination with abnormal findings: Secondary | ICD-10-CM | POA: Insufficient documentation

## 2024-06-02 LAB — CBC WITH DIFFERENTIAL/PLATELET
Basophils Absolute: 0 K/uL (ref 0.0–0.1)
Basophils Relative: 0.4 % (ref 0.0–3.0)
Eosinophils Absolute: 0.3 K/uL (ref 0.0–0.7)
Eosinophils Relative: 4.4 % (ref 0.0–5.0)
HCT: 48.3 % (ref 39.0–52.0)
Hemoglobin: 16.1 g/dL (ref 13.0–17.0)
Lymphocytes Relative: 19.6 % (ref 12.0–46.0)
Lymphs Abs: 1.3 K/uL (ref 0.7–4.0)
MCHC: 33.4 g/dL (ref 30.0–36.0)
MCV: 90.3 fl (ref 78.0–100.0)
Monocytes Absolute: 0.6 K/uL (ref 0.1–1.0)
Monocytes Relative: 9.3 % (ref 3.0–12.0)
Neutro Abs: 4.3 K/uL (ref 1.4–7.7)
Neutrophils Relative %: 66.3 % (ref 43.0–77.0)
Platelets: 226 K/uL (ref 150.0–400.0)
RBC: 5.35 Mil/uL (ref 4.22–5.81)
RDW: 13.8 % (ref 11.5–15.5)
WBC: 6.5 K/uL (ref 4.0–10.5)

## 2024-06-02 LAB — BASIC METABOLIC PANEL WITH GFR
BUN: 31 mg/dL — ABNORMAL HIGH (ref 6–23)
CO2: 33 meq/L — ABNORMAL HIGH (ref 19–32)
Calcium: 9.1 mg/dL (ref 8.4–10.5)
Chloride: 100 meq/L (ref 96–112)
Creatinine, Ser: 1.03 mg/dL (ref 0.40–1.50)
GFR: 78.42 mL/min
Glucose, Bld: 87 mg/dL (ref 70–99)
Potassium: 3.7 meq/L (ref 3.5–5.1)
Sodium: 138 meq/L (ref 135–145)

## 2024-06-02 LAB — URINALYSIS, ROUTINE W REFLEX MICROSCOPIC
Bilirubin Urine: NEGATIVE
Hgb urine dipstick: NEGATIVE
Ketones, ur: NEGATIVE
Leukocytes,Ua: NEGATIVE
Nitrite: NEGATIVE
Specific Gravity, Urine: 1.015 (ref 1.000–1.030)
Total Protein, Urine: NEGATIVE
Urine Glucose: NEGATIVE
Urobilinogen, UA: 1 (ref 0.0–1.0)
pH: 7 (ref 5.0–8.0)

## 2024-06-02 LAB — LIPID PANEL
Cholesterol: 214 mg/dL — ABNORMAL HIGH (ref 28–200)
HDL: 48.9 mg/dL
LDL Cholesterol: 135 mg/dL — ABNORMAL HIGH (ref 10–99)
NonHDL: 165.35
Total CHOL/HDL Ratio: 4
Triglycerides: 152 mg/dL — ABNORMAL HIGH (ref 10.0–149.0)
VLDL: 30.4 mg/dL (ref 0.0–40.0)

## 2024-06-02 LAB — PSA: PSA: 0.44 ng/mL (ref 0.10–4.00)

## 2024-06-02 NOTE — Progress Notes (Unsigned)
 "  Subjective:  Patient ID: Lawrence Smith, male    DOB: 1961/02/19  Age: 64 y.o. MRN: [id number]  CC: Hypertension and Annual Exam   HPI Lawrence Smith presents for a CPX and f/up ---  Discussed the use of AI scribe software for clinical note transcription with the patient, who gave verbal consent to proceed.  History of Present Illness Lawrence Smith is a 64 year old male who presents with right arm pain and numbness.  He woke up one morning with severe symptoms in his right arm, including numbness, weakness, tingling, and pain, describing it as feeling like his arm was going to fall off. Initially, he sought care at [facility] where he received steroids and pain relief injections, but experienced no improvement after the steroids wore off. 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 The MRI revealed a C6-7 disc protrusion touching his spinal cord and a bone spur.  He has been taking Advil frequently for pain management and is concerned about the amount he has been consuming. He also received prescriptions for Vicodin and hydrocodone  but has been avoiding them due to concerns about opioid use. He has taken muscle relaxers as needed and has used four out of twenty-four prescribed Vicoprofen tablets.  The pain is worse at night, particularly between 3 AM and 6 AM, causing significant sleep disruption. He describes the pain as being able to go from 'zero to a hundred,' with daytime being better than nighttime. Recently, he has noticed some improvement, being able to sleep for four hours at a time, compared to only 30 minutes to an hour previously.  No chest pain, shortness of breath, dizziness, or lightheadedness. He reports having good energy levels and no symptoms of high or low blood pressure.  He mentions a  weight loss of about 20 pounds last year due to dietary changes and has maintained this weight loss. His diet is not perfect but is not bad. He has not received a flu shot or pneumonia vaccine yet but is open to getting them.     Outpatient Medications Prior to Visit  Medication Sig Dispense Refill   aspirin  81 MG chewable tablet Chew 1 tablet (81 mg total) by mouth daily. 90 tablet 1   diclofenac  Sodium (VOLTAREN ) 1 % GEL Apply 4 g topically 4 (four) times daily. 100 g 0   HYDROcodone -acetaminophen  (NORCO/VICODIN) 5-325 MG tablet Take 1 tablet by mouth every 6 (six) hours as needed. 12 tablet 0   indapamide  (LOZOL ) 2.5 MG tablet TAKE 1 TABLET DAILY 90 tablet 3   KLOR-CON  M20 20 MEQ tablet TAKE 1 TABLET TWICE A DAY 180 tablet 3   methocarbamol  (ROBAXIN ) 750 MG tablet Take 1 tablet (750 mg total) by mouth 3 (three) times daily as needed (muscle spasm/pain). 15 tablet 0   mupirocin  ointment (BACTROBAN ) 2 % Apply 1 Application topically daily. 22 g 0   tadalafil  (CIALIS ) 20 MG tablet TAKE 1 TABLET DAILY AS NEEDED. (GENERIC EQUIVALENT FOR CIALIS ) 12 tablet 1   valACYclovir  (VALTREX ) 1000 MG tablet TAKE 1 TABLET TWICE A DAY 20 tablet 35   methylPREDNISolone  (MEDROL  DOSEPAK) 4 MG TBPK tablet Day 1: 8mg  before breakfast, 4 mg after lunch, 4 mg after supper, and 8 mg at bedtime Day 2: 4 mg before breakfast,  4 mg after lunch, 4 mg  after supper, and 8 mg  at bedtime Day 3:  4 mg  before breakfast, 4 mg  after lunch, 4 mg after supper, and 4 mg  at bedtime Day 4: 4 mg  before breakfast, 4 mg  after lunch, and 4 mg at bedtime Day 5: 4 mg  before breakfast and 4 mg at bedtime Day 6: 4 mg  before breakfast 21 tablet 0   Testosterone  20.25 MG/ACT (1.62%) GEL Place 2 Pump onto the skin daily. 225 g 1   valsartan  (DIOVAN ) 320 MG tablet TAKE 1 TABLET DAILY 90 tablet 1   rosuvastatin  (CRESTOR ) 20 MG tablet Take 1 tablet (20 mg total) by mouth daily. (Patient not taking: Reported on 10/21/2023) 90 tablet 1   No  facility-administered medications prior to visit.    ROS Review of Systems  Objective:  BP 138/86 (BP Location: Right Arm, Patient Position: Sitting, Cuff Size: Normal)   Pulse 90   Temp 98.7 F (37.1 C) (Temporal)   Resp 16   Ht 5' 9 (1.753 m)   Wt 192 lb 6.4 oz (87.3 kg)   SpO2 97%   BMI 28.41 kg/m   BP Readings from Last 3 Encounters:  06/02/24 138/86  05/26/24 124/86  05/17/24 (!) 153/95    Wt Readings from Last 3 Encounters:  06/02/24 192 lb 6.4 oz (87.3 kg)  05/17/24 192 lb 14.4 oz (87.5 kg)  05/11/24 193 lb (87.5 kg)    Physical Exam Cardiovascular:     Rate and Rhythm: Normal rate and regular rhythm.     Heart sounds: Normal heart sounds, S1 normal and S2 normal.     Comments: EKG- NSR, 93 bpm LAD No LVH, Q waves, or ST/T wave changes  Unchanged  Musculoskeletal:     Right lower leg: No edema.     Left lower leg: No edema.     Lab Results  Component Value Date   WBC 6.5 06/02/2024   HGB 16.1 06/02/2024   HCT 48.3 06/02/2024   PLT 226.0 06/02/2024   GLUCOSE 87 06/02/2024   CHOL 214 (H) 06/02/2024   TRIG 152.0 (H) 06/02/2024   HDL 48.90 06/02/2024   LDLDIRECT 153.0 03/14/2022   LDLCALC 135 (H) 06/02/2024   ALT 21 10/21/2023   AST 19 10/21/2023   NA 138 06/02/2024   K 3.7 06/02/2024   CL 100 06/02/2024   CREATININE 1.03 06/02/2024   BUN 31 (H) 06/02/2024   CO2 33 (H) 06/02/2024   TSH 1.93 10/21/2023   PSA 0.44 06/02/2024   HGBA1C 5.8 04/23/2023    MR Cervical Spine Wo Contrast Result Date: 05/26/2024 EXAM: MRI CERVICAL SPINE WITHOUT CONTRAST 05/26/2024 04:05:43 PM TECHNIQUE: Multiplanar multisequence MRI of the cervical spine was performed. COMPARISON: MRI cervical spine 07/17/2022. CLINICAL HISTORY: Cervical radiculopathy, no red flags; please imagethrough t2. radicular right arm pain, numbness right ring and small fingers. FINDINGS: BONES AND ALIGNMENT: Straightening of the normal cervical lordosis. Normal vertebral body heights. Bone  marrow signal is unremarkable. SPINAL CORD: Normal spinal cord size. No abnormal spinal cord signal. SOFT TISSUES: There is a 2.1 x 1.0 cm T2 hyperintense lesion with somewhat heterogeneous appearance in the paraspinal musculature left of midline at the level of T2. This focus was not included within the field of view on the prior study. Finding could reflect a schwannoma or other peripheral nerve sheath tumor. C2-C3: No significant disc herniation. No significant spinal canal stenosis. Facet arthrosis slightly greater on  the right. Uncovertebral hypertrophy on the right. There is mild right foraminal stenosis. C3-C4: There is a small disc bulge eccentric to the right. No significant spinal canal stenosis. Bilateral facet arthrosis, similar moderate to severe right foraminal stenosis. No neural foraminal narrowing. C4-C5: No significant disc herniation. No significant spinal canal stenosis. Bilateral facet arthrosis. Uncovertebral hypertrophy greater on the right. There is similar mild right foraminal stenosis. C5-C6: There is a disc osteophyte complex slightly eccentric to the right. Mild thickening of the ligamentum flavum. Mild spinal canal stenosis, similar to prior. Bilateral facet arthrosis. Uncovertebral hypertrophy on the right. There is similar moderate right foraminal stenosis. C6-C7: There is a right paracentral/subarticular disc protrusion which appears slightly increased in size from prior. Disc protrusion abuts and flattens the right ventral cervical cord without cord signal abnormality. Thickening of the ligamentum flavum. Mild spinal canal stenosis. Bilateral facet arthrosis. Uncovertebral hypertrophy greater on the right. There is similar moderate right foraminal stenosis. C7-T1: No significant disc herniation. No significant spinal canal stenosis. Bilateral facet arthrosis. No significant foraminal stenosis. IMPRESSION: 1. Right paracentral/subarticular disc protrusion at C6-7, slightly increased  in size from prior, abutting and flattening the right ventral cervical cord without cord signal abnormality. Mild spinal canal stenosis and moderate right foraminal stenosis. 2. Similar moderate to severe foraminal stenosis on the right at C3-4. 3. T2 hyperintense 2.1 x 1.0 cm lesion in the left paraspinal musculature at the T2 level, possibly representing a schwannoma or other peripheral nerve sheath tumor. Further evaluation with contrast-enhanced MRI is recommended. Electronically signed by: Donnice Mania MD 05/26/2024 04:30 PM EST RP Workstation: [id number]   Estimated Creatinine Clearance: 82.3 mL/min (by C-G formula based on SCr of 1.03 mg/dL).   The 10-year ASCVD risk score (Arnett DK, et al., 2019) is: 13.3%   Values used to calculate the score:     Age: 75 years     Clinically relevant sex: Male     Is Non-Hispanic African American: No     Diabetic: No     Tobacco smoker: No     Systolic Blood Pressure: 138 mmHg     Is BP treated: Yes     HDL Cholesterol: 48.9 mg/dL     Total Cholesterol: 214 mg/dL   Assessment & Plan:  Essential hypertension -     Basic metabolic panel with GFR; Future -     CBC with Differential/Platelet; Future -     EKG 12-Lead -     Urinalysis, Routine w reflex microscopic; Future -     Valsartan ; Take 1 tablet (320 mg total) by mouth daily.  Dispense: 90 tablet; Refill: 1  Hypogonadism in male -     CBC with Differential/Platelet; Future -     Testosterone  Total,Free,Bio, Males; Future -     Testosterone ; Place 2 Pump onto the skin daily.  Dispense: 225 g; Refill: 1  Need for immunization against influenza -     Flu vaccine trivalent PF, 6mos and older(Flulaval,Afluria,Fluarix,Fluzone)  Encounter for general adult medical examination with abnormal findings -     PSA; Future  Hyperlipidemia LDL goal <130 -     Lipid panel; Future -     Atorvastatin  Calcium ; Take 1 tablet (10 mg total) by mouth daily.  Dispense: 90 tablet; Refill: 1  Immunization  due -     Pneumococcal conjugate vaccine 20-valent     Follow-up: Return in about 6 months (around 11/30/2024).  Debby Molt, MD "

## 2024-06-02 NOTE — Patient Instructions (Signed)
 Health Maintenance, Male  Adopting a healthy lifestyle and getting preventive care are important in promoting health and wellness. Ask your health care provider about:  The right schedule for you to have regular tests and exams.  Things you can do on your own to prevent diseases and keep yourself healthy.  What should I know about diet, weight, and exercise?  Eat a healthy diet    Eat a diet that includes plenty of vegetables, fruits, low-fat dairy products, and lean protein.  Do not eat a lot of foods that are high in solid fats, added sugars, or sodium.  Maintain a healthy weight  Body mass index (BMI) is a measurement that can be used to identify possible weight problems. It estimates body fat based on height and weight. Your health care provider can help determine your BMI and help you achieve or maintain a healthy weight.  Get regular exercise  Get regular exercise. This is one of the most important things you can do for your health. Most adults should:  Exercise for at least 150 minutes each week. The exercise should increase your heart rate and make you sweat (moderate-intensity exercise).  Do strengthening exercises at least twice a week. This is in addition to the moderate-intensity exercise.  Spend less time sitting. Even light physical activity can be beneficial.  Watch cholesterol and blood lipids  Have your blood tested for lipids and cholesterol at 62 years of age, then have this test every 5 years.  You may need to have your cholesterol levels checked more often if:  Your lipid or cholesterol levels are high.  You are older than 62 years of age.  You are at high risk for heart disease.  What should I know about cancer screening?  Many types of cancers can be detected early and may often be prevented. Depending on your health history and family history, you may need to have cancer screening at various ages. This may include screening for:  Colorectal cancer.  Prostate cancer.  Skin cancer.  Lung  cancer.  What should I know about heart disease, diabetes, and high blood pressure?  Blood pressure and heart disease  High blood pressure causes heart disease and increases the risk of stroke. This is more likely to develop in people who have high blood pressure readings or are overweight.  Talk with your health care provider about your target blood pressure readings.  Have your blood pressure checked:  Every 3-5 years if you are 24-52 years of age.  Every year if you are 3 years old or older.  If you are between the ages of 60 and 72 and are a current or former smoker, ask your health care provider if you should have a one-time screening for abdominal aortic aneurysm (AAA).  Diabetes  Have regular diabetes screenings. This checks your fasting blood sugar level. Have the screening done:  Once every three years after age 66 if you are at a normal weight and have a low risk for diabetes.  More often and at a younger age if you are overweight or have a high risk for diabetes.  What should I know about preventing infection?  Hepatitis B  If you have a higher risk for hepatitis B, you should be screened for this virus. Talk with your health care provider to find out if you are at risk for hepatitis B infection.  Hepatitis C  Blood testing is recommended for:  Everyone born from [birth year] through 1963.  Anyone  with known risk factors for hepatitis C.  Sexually transmitted infections (STIs)  You should be screened each year for STIs, including gonorrhea and chlamydia, if:  You are sexually active and are younger than 62 years of age.  You are older than 62 years of age and your health care provider tells you that you are at risk for this type of infection.  Your sexual activity has changed since you were last screened, and you are at increased risk for chlamydia or gonorrhea. Ask your health care provider if you are at risk.  Ask your health care provider about whether you are at high risk for HIV. Your health care provider  may recommend a prescription medicine to help prevent HIV infection. If you choose to take medicine to prevent HIV, you should first get tested for HIV. You should then be tested every 3 months for as long as you are taking the medicine.  Follow these instructions at home:  Alcohol use  Do not drink alcohol if your health care provider tells you not to drink.  If you drink alcohol:  Limit how much you have to 0-2 drinks a day.  Know how much alcohol is in your drink. In the [place] one drink equals one 12 oz bottle of beer (355 mL), one 5 oz glass of wine (148 mL), or one 1 oz glass of hard liquor (44 mL).  Lifestyle  Do not use any products that contain nicotine or tobacco. These products include cigarettes, chewing tobacco, and vaping devices, such as e-cigarettes. If you need help quitting, ask your health care provider.  Do not use street drugs.  Do not share needles.  Ask your health care provider for help if you need support or information about quitting drugs.  General instructions  Schedule regular health, dental, and eye exams.  Stay current with your vaccines.  Tell your health care provider if:  You often feel depressed.  You have ever been abused or do not feel safe at home.  Summary  Adopting a healthy lifestyle and getting preventive care are important in promoting health and wellness.  Follow your health care provider's instructions about healthy diet, exercising, and getting tested or screened for diseases.  Follow your health care provider's instructions on monitoring your cholesterol and blood pressure.  This information is not intended to replace advice given to you by your health care provider. Make sure you discuss any questions you have with your health care provider.  Document Revised: 10/02/2020 Document Reviewed: 10/02/2020  Elsevier Patient Education  2024 [employer].

## 2024-06-03 LAB — TESTOSTERONE TOTAL,FREE,BIO, MALES
Albumin: 4.2 g/dL (ref 3.6–5.1)
Sex Hormone Binding: 24 nmol/L (ref 22–77)
Testosterone: 121 ng/dL — ABNORMAL LOW (ref 250–827)

## 2024-06-03 MED ORDER — VALSARTAN 320 MG PO TABS: 320.0000 mg | ORAL_TABLET | Freq: Every day | ORAL | 1 refills | Status: AC

## 2024-06-03 MED ORDER — ATORVASTATIN CALCIUM 10 MG PO TABS: 10.0000 mg | ORAL_TABLET | Freq: Every day | ORAL | 1 refills | Status: AC

## 2024-06-03 MED ORDER — TESTOSTERONE 20.25 MG/ACT (1.62%) TD GEL: 2.0000 | Freq: Every day | TRANSDERMAL | 1 refills | Status: AC

## 2024-06-08 ENCOUNTER — Other Ambulatory Visit (HOSPITAL_COMMUNITY): Payer: Self-pay

## 2024-06-08 MED ORDER — CYCLOBENZAPRINE HCL 10 MG PO TABS
10.0000 mg | ORAL_TABLET | Freq: Two times a day (BID) | ORAL | 0 refills | Status: AC
  Filled 2024-06-08: qty 40, 20d supply, fill #0

## 2024-06-08 MED ORDER — TRAMADOL HCL 50 MG PO TABS
50.0000 mg | ORAL_TABLET | Freq: Four times a day (QID) | ORAL | 0 refills | Status: AC | PRN
  Filled 2024-06-08: qty 30, 8d supply, fill #0

## 2024-06-09 ENCOUNTER — Other Ambulatory Visit (HOSPITAL_COMMUNITY): Payer: Self-pay

## 2024-06-09 MED ORDER — DIAZEPAM 5 MG PO TABS
5.0000 mg | ORAL_TABLET | ORAL | 0 refills | Status: AC
  Filled 2024-06-09: qty 2, 1d supply, fill #0

## 2024-06-25 ENCOUNTER — Other Ambulatory Visit (HOSPITAL_COMMUNITY): Payer: Self-pay

## 2024-06-25 MED ORDER — HYDROCODONE-ACETAMINOPHEN 5-325 MG PO TABS
1.0000 | ORAL_TABLET | Freq: Four times a day (QID) | ORAL | 0 refills | Status: AC | PRN
  Filled 2024-06-25: qty 40, 10d supply, fill #0

## 2024-06-25 MED ORDER — METHOCARBAMOL 500 MG PO TABS
500.0000 mg | ORAL_TABLET | Freq: Three times a day (TID) | ORAL | 0 refills | Status: AC
  Filled 2024-06-25: qty 40, 14d supply, fill #0

## 2024-06-26 ENCOUNTER — Other Ambulatory Visit (HOSPITAL_COMMUNITY): Payer: Self-pay

## 2024-12-01 ENCOUNTER — Ambulatory Visit: Admitting: Internal Medicine
